# Patient Record
Sex: Female | Born: 1945 | Race: White | Hispanic: No | Marital: Married
Health system: Midwestern US, Academic
[De-identification: ages and names within clinical notes are randomized; demographics above are authoritative.]

---

## 2017-04-18 ENCOUNTER — Ambulatory Visit: Admit: 2017-04-18 | Discharge: 2017-04-19 | Payer: MEDICARE

## 2017-04-18 ENCOUNTER — Encounter: Admit: 2017-04-18 | Discharge: 2017-04-18 | Payer: MEDICARE

## 2017-04-18 DIAGNOSIS — I10 Essential (primary) hypertension: Principal | ICD-10-CM

## 2017-04-18 DIAGNOSIS — E782 Mixed hyperlipidemia: ICD-10-CM

## 2017-04-18 DIAGNOSIS — I440 Atrioventricular block, first degree: ICD-10-CM

## 2017-04-18 DIAGNOSIS — E785 Hyperlipidemia, unspecified: ICD-10-CM

## 2017-04-18 DIAGNOSIS — E7800 Pure hypercholesterolemia, unspecified: ICD-10-CM

## 2017-04-18 MED ORDER — LOSARTAN 25 MG PO TAB
ORAL_TABLET | ORAL | 3 refills | 30.00000 days | Status: AC
Start: 2017-04-18 — End: 2017-04-19

## 2017-04-18 MED ORDER — ATORVASTATIN 40 MG PO TAB
40 mg | ORAL_TABLET | Freq: Every day | ORAL | 3 refills | Status: AC
Start: 2017-04-18 — End: 2018-03-27

## 2017-04-19 ENCOUNTER — Encounter: Admit: 2017-04-19 | Discharge: 2017-04-19 | Payer: MEDICARE

## 2017-04-19 MED ORDER — LOSARTAN 50 MG PO TAB
75 mg | ORAL_TABLET | ORAL | 3 refills | 30.00000 days | Status: AC | PRN
Start: 2017-04-19 — End: 2018-01-24

## 2017-04-19 MED ORDER — LOSARTAN 50 MG PO TAB
75 mg | ORAL_TABLET | ORAL | 3 refills | 90.00000 days | Status: AC | PRN
Start: 2017-04-19 — End: 2017-04-19

## 2017-04-22 LAB — ALT (SGPT): Lab: 21 — ABNORMAL HIGH (ref 0–14)

## 2017-04-22 LAB — BASIC METABOLIC PANEL
Lab: 142
Lab: 4.5
Lab: 81

## 2017-04-22 LAB — LIPID PROFILE
Lab: 121
Lab: 159
Lab: 24 — ABNORMAL HIGH (ref 83–110)
Lab: 3
Lab: 53 — ABNORMAL HIGH (ref 9.8–20.1)

## 2017-04-22 LAB — AST (SGOT): Lab: 15

## 2017-06-20 ENCOUNTER — Encounter: Admit: 2017-06-20 | Discharge: 2017-06-20 | Payer: MEDICARE

## 2017-06-20 DIAGNOSIS — I10 Essential (primary) hypertension: Principal | ICD-10-CM

## 2017-06-20 DIAGNOSIS — E7800 Pure hypercholesterolemia, unspecified: ICD-10-CM

## 2017-07-04 ENCOUNTER — Encounter: Admit: 2017-07-04 | Discharge: 2017-07-04 | Payer: MEDICARE

## 2017-07-04 ENCOUNTER — Ambulatory Visit: Admit: 2017-07-04 | Discharge: 2017-07-05 | Payer: MEDICARE

## 2017-07-04 DIAGNOSIS — I440 Atrioventricular block, first degree: ICD-10-CM

## 2017-07-04 DIAGNOSIS — E7800 Pure hypercholesterolemia, unspecified: ICD-10-CM

## 2017-07-04 DIAGNOSIS — I10 Essential (primary) hypertension: Principal | ICD-10-CM

## 2017-07-04 DIAGNOSIS — E785 Hyperlipidemia, unspecified: ICD-10-CM

## 2017-07-04 MED ORDER — HYDROCHLOROTHIAZIDE 25 MG PO TAB
ORAL_TABLET | ORAL | 3 refills | 28.00000 days | Status: AC
Start: 2017-07-04 — End: 2018-06-09

## 2017-07-04 NOTE — Progress Notes
Date of Service: 07/04/2017    Crystal Savage is a 71 y.o. female.       HPI     I had the pleasure of seeing Crystal Savage today for follow-up of her nonobstructive coronary artery disease, PVC induced cardiomyopathy controlled with flecainide, hypertension, and hyperlipidemia.  A holter monitor in the past found a 30.29 % PVC burden.  She was started on flecainide and follow-up Holter monitor from November 2016 revealed her PVC burden was less than 1%..  Her most recent echocardiogram from 2016 revealed an EF of 55%.  She was seen in our office by Dr. Avie Arenas on Mar 21, 2017 and at that time her blood pressure was elevated.  She was asked to increase her losartan to 50 mg in the morning and 25 mg in the evening if her blood pressures at home are staying greater than 140/90.    She followed up in our office on April 18, 2017 and noted she had not increased her losartan dose.  Her blood pressure was elevated at 156/84 and 154/84.  She was asked to increase her losartan to 50 mg in the morning and 25 mg in the evening.  She was asked to check a BMP in the next few days.  Those results are as below.    Results for Crystal, Savage (MRN [id number]) as of 07/04/2017 08:54   Ref. Range 04/22/2017 00:00   Sodium Unknown 142   Potassium Unknown 4.5   Chloride Unknown 107   CO2 Unknown 25.0   Anion Gap Latest Ref Range: 0 - 14  15 (H)   Blood Urea Nitrogen Latest Ref Range: 9.8 - 20.1  21.0 (H)   Creatinine Unknown 0.84   eGFR Non African American Unknown 70.6   eGFR African American Unknown 81.4   Glucose Latest Ref Range: 83 - 110  116 (H)   Calcium Unknown 9.9   AST (SGOT) Unknown 15   ALT (SGPT) Unknown 21   Cholesterol Unknown 159   Triglycerides Unknown 121   HDL Unknown 53   LDL Unknown 81   VLDL Unknown 24   Cholesterol/HDL Ratio Unknown 3       [name] presents to the office today telling me that she has noticed a few more episodes of lower leg edema recently.  When she is on her feet more she notices that they can be swollen up to her knees and feel tight.  Yesterday she drove from  her home to [facility] and did a lot of sitting.  She had swollen feet and lower legs last night.  She does notice a little dyspnea on exertion when she walks upstairs.  She tells me this has been stable for her for quite some time.  She does not do much exercising.  She is wearing her CPAP consistently at night.  Occasionally she does notice her heart beating a little harder but never faster.  She does notice this at rest.    She brought in her home blood pressure log which shows her blood pressures have been ranging from the 116 - 140s systolic most of the time with occasional blood pressures in the 150s systolic.    Assessment and plan    1.  Hypertension.  Her blood pressure is 140/80 and 138/76 today.  She has been asked to add hydrochlorothiazide 25 mg one half tablet each morning.  This will bring her blood pressure down a little and help with her lower leg  edema.  We also spent quite a bit of time discussing working hard at a less than 2000 mg sodium.  She will continue to sit and check her blood pressure at home.  She has been given a lab order to check a BMP in 1 week and we will call her with these results.  She will follow-up in our office with Dr. Avie Arenas in 2 months.    2.  PVC induced cardiomyopathy that has improved on flecainide therapy.  She occasionally notices her heart beating hard but not past.  She feels she is tolerating her flecainide without any problems.  When she follows up with Dr. Avie Arenas in October she will have a follow-up echocardiogram.    3.  Hyperlipidemia.  Her labs from June were acceptable and she tells me she is tolerating her atorvastatin without any problems.    4.  Obstructive sleep apnea.  She continues to wear her CPAP consistently at night.    Thank you for the opportunity to participate in care of this patient. Please feel free to call Crystal Savage if you have any questions or concerns.    Loraine Leriche, APRN-C  DRB       Vitals:    07/04/17 0751 07/04/17 0802   BP: 140/80 138/76   Pulse: 64    Weight: 86.6 kg (191 lb)    Height: 1.626 m (5' 4)      Body mass index is 32.79 kg/m???.     Past Medical History  Patient Active Problem List    Diagnosis Date Noted   ??? Encounter for monitoring flecainide therapy 03/21/2017   ??? 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 OSA (obstructive sleep apnea) 10/02/2016     Wearing CPAP consistently     ??? LBBB (left bundle branch block) 08/26/2014   ??? Abnormal cardiovascular function study 09/28/2010     10/02/10: Cardiac Cath: EF 40-45%. Mild plaquing to LM, LAD with 30% mid lesion, otherwise non obstructive dz.     ??? Cardiomyopathy (HCC) 09/28/2010   ??? 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 HTN (hypertension) 04/14/2009   ??? Hyperlipidemia 04/14/2009   ??? Under observation for suspected coronary artery disease 04/14/2009     Previous thallium dated 05/31/2005--negative for ischemia. Risk factors for coronary artery disease.            Review of Systems   Constitution: Positive for weight gain.   HENT: Positive for tinnitus.    Eyes: Negative.    Cardiovascular: Negative.    Respiratory: Positive for shortness of breath.    Endocrine: Negative.    Hematologic/Lymphatic: Negative.    Skin: Negative.    Musculoskeletal: Positive for back pain.   Gastrointestinal: Negative.    Genitourinary: Negative.    Neurological: Negative.    Psychiatric/Behavioral: Negative.    Allergic/Immunologic: Negative.    She denies having chest pain,  PND, orthopnea, lightheadedness, dizziness, pre-syncope, syncope, coughing, wheezing,  sputum production, or hemoptysis. She uses no tobacco products.      Physical Exam     General Appearance: resting comfortably, no acute distress, obese  Skin: warm, moist, no ulcers or xanthomas  Digits and Nails: no clubbing Eyes: conjunctivae and lids normal, pupils are equal and round  Lips & Oral Mucosa: no pallor or cyanosis  Ear, Nose, Throat: No deformities  Neck Veins: neck veins are flat, neck veins are not distended  Thyroid: no nodules, masses, tenderness or enlargement  Chest Inspection: chest is normal  in appearance  Respiratory Effort: breathing is unlabored, no respiratory distress  Auscultation/Percussion: lungs clear to auscultation, no rales, rhonchi, or wheezing  PMI: PMI not enlarged or displaced  Cardiac Rhythm: regular rhythm and normal rate  Cardiac Auscultation: Normal S1 & S2, no S3 or S4, no rub  Murmurs: no cardiac murmurs   Carotid Arteries: normal carotid upstroke bilaterally, no bruits  Pedal Pulses: normal symmetric pedal pulses  Lower Extremity Edema: no lower extremity edema  Abdominal Exam: soft, non-tender, no masses, bowel sounds normal  Abdominal Aorta: nonpalpable abdominal aorta; no abdominal bruits  Liver & Spleen: no organomegaly  Gait & Station: normal balance and gait  Muscle Strength: normal strength and tone  Neurologic Exam: neurological assessment grossly intact  Orientation: oriented to time, place and person  Affect & Mood: appropriate and sustained affect  Other: moves all extremities            Problems Addressed Today  No diagnosis found.                  Current Medications (including today's revisions)  ??? ACETAMINOPHEN (TYLENOL ARTHRITIS PAIN PO) Take 650 mg by mouth as Needed.   ??? Aspirin 81 mg Tab Take 1 Tab by mouth Daily.   ??? atorvastatin (LIPITOR) 40 mg tablet Take 1 tablet by mouth daily.   ??? cetirizine (ZYRTEC) 10 mg tablet Take 10 mg by mouth every morning.   ??? flecainide (TAMBOCOR) 50 mg tablet TAKE ONE TABLET BY MOUTH TWICE DAILY   ??? FLUTICASONE PROPIONATE (FLONASE NA) Apply 1 spray into nose as directed daily.   ??? losartan (COZAAR) 50 mg tablet Take 1.5 tablets by mouth as Needed. 1 tablet in the morning and 1/2 tablet in the evening

## 2017-07-04 NOTE — Telephone Encounter
Patient requested echo to be done at [facility].  Order faxed to [facility] scheduling.  Scheduling will call pt schedule echo at the end of Sept.

## 2017-07-12 LAB — BASIC METABOLIC PANEL
Lab: 0.9 U/L (ref 6–29)
Lab: 106 mg/dL (ref 0.2–1.2)
Lab: 108
Lab: 11
Lab: 141 g/dL (ref >60)
Lab: 16 U/L (ref 10–35)
Lab: 28 U/L (ref 33–130)
Lab: 4.1 (calc) (ref 1.0–2.5)
Lab: 9.9

## 2017-07-16 ENCOUNTER — Encounter: Admit: 2017-07-16 | Discharge: 2017-07-16 | Payer: MEDICARE

## 2017-07-16 DIAGNOSIS — I10 Essential (primary) hypertension: Principal | ICD-10-CM

## 2017-07-16 NOTE — Telephone Encounter
Called pt with labs results.

## 2017-08-07 ENCOUNTER — Encounter: Admit: 2017-08-07 | Discharge: 2017-08-07 | Payer: MEDICARE

## 2017-08-07 ENCOUNTER — Ambulatory Visit: Admit: 2017-08-07 | Discharge: 2017-08-08 | Payer: MEDICARE

## 2017-08-07 DIAGNOSIS — I10 Essential (primary) hypertension: Principal | ICD-10-CM

## 2017-08-09 ENCOUNTER — Encounter: Admit: 2017-08-09 | Discharge: 2017-08-09 | Payer: MEDICARE

## 2017-08-09 NOTE — Telephone Encounter
-----   Message from Sabino Gasser, MD sent at 08/09/2017 12:55 PM CDT -----  Regarding: FW: Please review for [facility]  Ejection fraction 45% on echo.  This is about the same as last time.  She sees Dr. Avie Arenas for follow-up in October and this echo was to precede that appointment.  We can let her know that 45% but Dr. Avie Arenas will need to explain to her what that means and if anything needs to be done about it  ----- Message -----  From: Rogelia Boga, RN  Sent: 08/07/2017   2:59 PM  To: Sabino Gasser, MD  Subject: Please review for [facility]                            Dr. Doristine Counter,    Would you please review these results for [facility] while she is out.  Please let me know your recommendations.      Thank you!  Asher Muir  ----- Message -----  From: Lenice Llamas, MD  Sent: 08/07/2017   2:06 PM  To: Nickolas Madrid, MD

## 2017-08-09 NOTE — Telephone Encounter
Left message with results.  Left callback number for any questions or concerns.

## 2017-08-22 ENCOUNTER — Ambulatory Visit: Admit: 2017-08-22 | Discharge: 2017-08-23 | Payer: MEDICARE

## 2017-08-22 ENCOUNTER — Encounter: Admit: 2017-08-22 | Discharge: 2017-08-22 | Payer: MEDICARE

## 2017-08-22 DIAGNOSIS — I447 Left bundle-branch block, unspecified: ICD-10-CM

## 2017-08-22 DIAGNOSIS — I493 Ventricular premature depolarization: ICD-10-CM

## 2017-08-22 DIAGNOSIS — E785 Hyperlipidemia, unspecified: ICD-10-CM

## 2017-08-22 DIAGNOSIS — E7800 Pure hypercholesterolemia, unspecified: ICD-10-CM

## 2017-08-22 DIAGNOSIS — I10 Essential (primary) hypertension: Principal | ICD-10-CM

## 2017-08-22 DIAGNOSIS — I440 Atrioventricular block, first degree: ICD-10-CM

## 2017-08-22 DIAGNOSIS — R002 Palpitations: ICD-10-CM

## 2017-08-22 DIAGNOSIS — I429 Cardiomyopathy, unspecified: ICD-10-CM

## 2017-08-22 DIAGNOSIS — G4733 Obstructive sleep apnea (adult) (pediatric): ICD-10-CM

## 2017-08-22 NOTE — Progress Notes
Date of Service: 08/22/2017    Crystal Savage is a 71 y.o. female.       HPI     This is a very nice 71 year old white female that has a history of hypertension and PVC induced cardiomyopathy.  She reports that in the past month patient experienced to episodes of heart palpitations, it felt like irregular heartbeat, they occurred on July 25, 2017 on August 13, 2017.  This episode lasted up to 2 hours, they were not associated with lightheadedness, dizziness, presyncope or syncope.  It then occurred at night.  Patient decided not to present to the emergency room.  She did measure her blood pressure and the heart rate and she thinks that the pulse was up to 120 bpm.    Today in our office his blood pressure was mildly elevated, the heart rate was well controlled at 66 bpm.  She was evaluated with a 2D echo Doppler study on August 07, 2017, ejection fraction is 45%, the PA pressure was normal, there are no significant valvular abnormalities.         Vitals:    08/22/17 0822 08/22/17 0830   BP: 162/84 156/76   Pulse: 66    Weight: 85.8 kg (189 lb 3.2 oz)    Height: 1.6 m (5' 3)      Body mass index is 33.52 kg/m???.     Past Medical History  Patient Active Problem List    Diagnosis Date Noted   ??? Encounter for monitoring flecainide therapy 03/21/2017   ??? 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 OSA (obstructive sleep apnea) 10/02/2016     Wearing CPAP consistently     ??? LBBB (left bundle branch block) 08/26/2014   ??? Abnormal cardiovascular function study 09/28/2010     10/02/10: Cardiac Cath: EF 40-45%. Mild plaquing to LM, LAD with 30% mid lesion, otherwise non obstructive dz.     ??? Cardiomyopathy (HCC) 09/28/2010   ??? 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 HTN (hypertension) 04/14/2009   ??? Hyperlipidemia 04/14/2009   ??? Under observation for suspected coronary artery disease 04/14/2009     Previous thallium dated 05/31/2005--negative for ischemia. Risk factors for coronary artery disease. Review of Systems   Constitution: Negative.   HENT: Positive for tinnitus.    Eyes: Negative.    Cardiovascular: Positive for dyspnea on exertion, irregular heartbeat and palpitations.   Respiratory: Positive for shortness of breath.    Endocrine: Negative.    Hematologic/Lymphatic: Negative.    Skin: Negative.    Musculoskeletal: Positive for back pain.   Gastrointestinal: Negative.    Genitourinary: Negative.    Neurological: Positive for headaches.   Psychiatric/Behavioral: Negative.    Allergic/Immunologic: Negative.        Physical Exam  General Appearance: normal in appearance  Skin: warm, moist, no ulcers or xanthomas  Eyes: conjunctivae and lids normal, pupils are equal and round  Lips & Oral Mucosa: no pallor or cyanosis  Neck Veins: neck veins are flat, neck veins are not distended  Chest Inspection: chest is normal in appearance  Respiratory Effort: breathing comfortably, no respiratory distress  Auscultation/Percussion: lungs clear to auscultation, no rales or rhonchi, no wheezing  Cardiac Rhythm: regular rhythm and normal rate  Cardiac Auscultation: S1, S2 normal, no rub, no gallop  Murmurs: no murmur  Carotid Arteries: normal carotid upstroke bilaterally, no bruit  Lower Extremity Edema: no lower extremity edema  Abdominal Exam: soft, non-tender, no masses, bowel sounds normal  Liver & Spleen: no organomegaly  Language and Memory: patient responsive and seems to comprehend information  Neurologic Exam: neurological assessment grossly intact      Cardiovascular Studies  Twelve-lead EKG demonstrates normal sinus rhythm, left axis deviation and left anterior fascicular block    Problems Addressed Today  Encounter Diagnoses   Name Primary?   ??? Essential hypertension Yes   ??? Pure hypercholesterolemia    ??? Cardiomyopathy, unspecified type (HCC)    ??? First degree AV block    ??? LBBB (left bundle branch block)    ??? Premature ventricular contractions (PVCs) (VPCs)    ??? OSA (obstructive sleep apnea) ??? Heart palpitations        Assessment and Plan     Assessment:    1.  Recurrent episodes of heart palpitations  ??? Patient experienced 2 episodes, this occurred on July 25, 2017 on August 13, 2017, the lasted up to 2 hours and occurred at night  ??? Patient did not have associated presyncopal episodes    2.  History of poorly controlled hypertension  ??? Today in the office this remains suboptimally controlled  ??? However patient states that at home blood pressure is well controlled, she provided me with a home recordings and I confirmed that    3.  History of PVCs and probably PVC induced cardiomyopathy  ??? In the past on a Holter monitor patient was found to have approximately 30% of the number of beats represented by PVCs  ??? She was evaluated with a heart catheterization in November 2011, she was not found to have any obstructive CAD  ??? Her symptoms and the PVCs have greatly improved after being started on a class Ic antiarrhythmic drug therapy that she continues to the present day (flecainide 50 mg p.o. twice daily).    Plan:    1.  I did suggest to either increase the flecainide  to 100 mg p.o. nightly, continue 50 mg p.o. every morning, the other alternative option would be to continue the present dose of flecainide 50 mg p.o. twice daily and take an extra pill in the setting of heart palpitations.  I think patient opted for the second alternative.  2.  I suggested to have a chemistry profile drawn at your office and correct electrolytes if necessary, the goal is to keep potassium above 4.0 mEq/L and magnesium above 2.0 mg/dL  3.  I asked the patient to continue to monitor her symptoms and be in touch with our office if there are any significant changes.  I also asked her that if she will experience any further episodes that would last longer than 30 minutes to 1 hour she should report either to the emergency room department, your office or our office for an electrocardiogram recording and documentation of her arrhythmia.  4.  I plan to see her back in the office in 6 months.         Current Medications (including today's revisions)  ??? ACETAMINOPHEN (TYLENOL ARTHRITIS PAIN PO) Take 650 mg by mouth as Needed.   ??? Aspirin 81 mg Tab Take 1 Tab by mouth Daily.   ??? atorvastatin (LIPITOR) 40 mg tablet Take 1 tablet by mouth daily.   ??? carvedilol (COREG) 25 mg tablet Take 1 tablet by mouth twice daily.   ??? cetirizine (ZYRTEC) 10 mg tablet Take 10 mg by mouth every morning.   ??? flecainide (TAMBOCOR) 50 mg tablet TAKE ONE TABLET BY MOUTH TWICE DAILY   ??? FLUTICASONE PROPIONATE (  FLONASE NA) Apply 1 spray into nose as directed daily.   ??? hydroCHLOROthiazide (HYDRODIURIL) 25 mg tablet Take 1/2 tablet each am   ??? losartan (COZAAR) 50 mg tablet Take 1.5 tablets by mouth as Needed. 1 tablet in the morning and 1/2 tablet in the evening (Patient taking differently: Take 75 mg by mouth daily. 1 tablet in the morning and 1/2 tablet in the evening)

## 2017-09-22 ENCOUNTER — Encounter: Admit: 2017-09-22 | Discharge: 2017-09-22 | Payer: MEDICARE

## 2017-09-23 MED ORDER — FLECAINIDE 50 MG PO TAB
ORAL_TABLET | Freq: Two times a day (BID) | ORAL | 3 refills | 30.00000 days | Status: AC
Start: 2017-09-23 — End: 2018-03-27

## 2017-10-12 ENCOUNTER — Encounter: Admit: 2017-10-12 | Discharge: 2017-10-12 | Payer: MEDICARE

## 2017-10-12 DIAGNOSIS — I10 Essential (primary) hypertension: ICD-10-CM

## 2017-10-12 DIAGNOSIS — E782 Mixed hyperlipidemia: Principal | ICD-10-CM

## 2017-10-14 MED ORDER — ATORVASTATIN 40 MG PO TAB
ORAL_TABLET | Freq: Every day | 3 refills | Status: AC
Start: 2017-10-14 — End: 2018-11-03

## 2017-12-01 ENCOUNTER — Encounter: Admit: 2017-12-01 | Discharge: 2017-12-01 | Payer: MEDICARE

## 2017-12-01 DIAGNOSIS — E782 Mixed hyperlipidemia: Principal | ICD-10-CM

## 2017-12-01 DIAGNOSIS — I10 Essential (primary) hypertension: ICD-10-CM

## 2017-12-03 MED ORDER — CARVEDILOL 25 MG PO TAB
ORAL_TABLET | Freq: Two times a day (BID) | ORAL | 3 refills | 90.00000 days | Status: AC
Start: 2017-12-03 — End: 2018-05-16

## 2018-01-24 ENCOUNTER — Encounter: Admit: 2018-01-24 | Discharge: 2018-01-24 | Payer: MEDICARE

## 2018-01-24 MED ORDER — LOSARTAN 25 MG PO TAB
ORAL_TABLET | ORAL | 0 refills | 30.00000 days | Status: AC
Start: 2018-01-24 — End: 2018-04-24

## 2018-03-27 ENCOUNTER — Encounter: Admit: 2018-03-27 | Discharge: 2018-03-27 | Payer: MEDICARE

## 2018-03-27 ENCOUNTER — Ambulatory Visit: Admit: 2018-03-27 | Discharge: 2018-03-28 | Payer: MEDICARE

## 2018-03-27 DIAGNOSIS — G4733 Obstructive sleep apnea (adult) (pediatric): ICD-10-CM

## 2018-03-27 DIAGNOSIS — I429 Cardiomyopathy, unspecified: ICD-10-CM

## 2018-03-27 DIAGNOSIS — E7800 Pure hypercholesterolemia, unspecified: ICD-10-CM

## 2018-03-27 DIAGNOSIS — R002 Palpitations: ICD-10-CM

## 2018-03-27 DIAGNOSIS — I10 Essential (primary) hypertension: Principal | ICD-10-CM

## 2018-03-27 DIAGNOSIS — E785 Hyperlipidemia, unspecified: ICD-10-CM

## 2018-03-27 DIAGNOSIS — I493 Ventricular premature depolarization: ICD-10-CM

## 2018-03-27 DIAGNOSIS — I447 Left bundle-branch block, unspecified: ICD-10-CM

## 2018-03-27 DIAGNOSIS — Z5181 Encounter for therapeutic drug level monitoring: ICD-10-CM

## 2018-03-27 DIAGNOSIS — I440 Atrioventricular block, first degree: ICD-10-CM

## 2018-03-27 DIAGNOSIS — Z0389 Encounter for observation for other suspected diseases and conditions ruled out: ICD-10-CM

## 2018-03-27 LAB — BASIC METABOLIC PANEL
Lab: 107
Lab: 9.7
Lab: 0.8
Lab: 122 — ABNORMAL HIGH (ref 83–110)
Lab: 13
Lab: 141
Lab: 20
Lab: 25
Lab: 3.8

## 2018-03-27 MED ORDER — FLECAINIDE 50 MG PO TAB
50-100 mg | ORAL_TABLET | ORAL | 3 refills | 30.00000 days | Status: AC
Start: 2018-03-27 — End: 2018-03-27

## 2018-03-27 MED ORDER — FLECAINIDE 50 MG PO TAB
50-100 mg | ORAL_TABLET | ORAL | 3 refills | 30.00000 days | Status: AC
Start: 2018-03-27 — End: 2018-05-16

## 2018-04-16 ENCOUNTER — Ambulatory Visit: Admit: 2018-04-16 | Discharge: 2018-04-17 | Payer: MEDICARE

## 2018-04-16 DIAGNOSIS — I447 Left bundle-branch block, unspecified: ICD-10-CM

## 2018-04-16 DIAGNOSIS — I493 Ventricular premature depolarization: ICD-10-CM

## 2018-04-16 DIAGNOSIS — E7800 Pure hypercholesterolemia, unspecified: ICD-10-CM

## 2018-04-16 DIAGNOSIS — I10 Essential (primary) hypertension: Principal | ICD-10-CM

## 2018-04-16 DIAGNOSIS — I429 Cardiomyopathy, unspecified: ICD-10-CM

## 2018-04-18 ENCOUNTER — Ambulatory Visit: Admit: 2018-04-18 | Discharge: 2018-04-18 | Payer: MEDICARE

## 2018-04-18 ENCOUNTER — Encounter: Admit: 2018-04-18 | Discharge: 2018-04-18 | Payer: MEDICARE

## 2018-04-18 ENCOUNTER — Ambulatory Visit: Admit: 2018-04-18 | Discharge: 2018-04-19 | Payer: MEDICARE

## 2018-04-18 DIAGNOSIS — I429 Cardiomyopathy, unspecified: ICD-10-CM

## 2018-04-18 DIAGNOSIS — I447 Left bundle-branch block, unspecified: ICD-10-CM

## 2018-04-18 DIAGNOSIS — E7800 Pure hypercholesterolemia, unspecified: ICD-10-CM

## 2018-04-18 DIAGNOSIS — I493 Ventricular premature depolarization: ICD-10-CM

## 2018-04-18 DIAGNOSIS — I10 Essential (primary) hypertension: Principal | ICD-10-CM

## 2018-04-18 MED ORDER — ALBUTEROL SULFATE 90 MCG/ACTUATION IN HFAA
2 | RESPIRATORY_TRACT | 0 refills | Status: DC | PRN
Start: 2018-04-18 — End: 2018-04-23

## 2018-04-18 MED ORDER — AMINOPHYLLINE 500 MG/20 ML IV SOLN
50 mg | INTRAVENOUS | 0 refills | Status: AC | PRN
Start: 2018-04-18 — End: ?

## 2018-04-18 MED ORDER — SODIUM CHLORIDE 0.9 % IV SOLP
250 mL | INTRAVENOUS | 0 refills | Status: AC | PRN
Start: 2018-04-18 — End: ?

## 2018-04-18 MED ORDER — REGADENOSON 0.4 MG/5 ML IV SYRG
.4 mg | Freq: Once | INTRAVENOUS | 0 refills | Status: CP
Start: 2018-04-18 — End: ?

## 2018-04-18 MED ORDER — NITROGLYCERIN 0.4 MG SL SUBL
.4 mg | SUBLINGUAL | 0 refills | Status: AC | PRN
Start: 2018-04-18 — End: ?

## 2018-04-21 ENCOUNTER — Encounter: Admit: 2018-04-21 | Discharge: 2018-04-21 | Payer: MEDICARE

## 2018-04-22 ENCOUNTER — Encounter: Admit: 2018-04-22 | Discharge: 2018-04-22 | Payer: MEDICARE

## 2018-04-24 ENCOUNTER — Encounter: Admit: 2018-04-24 | Discharge: 2018-04-24 | Payer: MEDICARE

## 2018-04-24 MED ORDER — LOSARTAN 25 MG PO TAB
ORAL_TABLET | ORAL | 3 refills | 90.00000 days | Status: AC
Start: 2018-04-24 — End: 2018-10-15

## 2018-05-09 ENCOUNTER — Encounter: Admit: 2018-05-09 | Discharge: 2018-05-09 | Payer: MEDICARE

## 2018-05-09 LAB — TROPONIN-I

## 2018-05-09 LAB — COMPREHENSIVE METABOLIC PANEL
Lab: 0.9 — ABNORMAL LOW (ref 33.0–37.0)
Lab: 1
Lab: 10
Lab: 105
Lab: 120 — ABNORMAL HIGH (ref 83–110)
Lab: 143
Lab: 19
Lab: 27
Lab: 4.3
Lab: 7.5
Lab: 83

## 2018-05-09 LAB — CBC: Lab: 7.5

## 2018-05-16 ENCOUNTER — Ambulatory Visit: Admit: 2018-05-16 | Discharge: 2018-05-17 | Payer: MEDICARE

## 2018-05-16 ENCOUNTER — Encounter: Admit: 2018-05-16 | Discharge: 2018-05-16 | Payer: MEDICARE

## 2018-05-16 DIAGNOSIS — E782 Mixed hyperlipidemia: Principal | ICD-10-CM

## 2018-05-16 DIAGNOSIS — I10 Essential (primary) hypertension: Principal | ICD-10-CM

## 2018-05-16 DIAGNOSIS — I493 Ventricular premature depolarization: ICD-10-CM

## 2018-05-16 DIAGNOSIS — Z5181 Encounter for therapeutic drug level monitoring: ICD-10-CM

## 2018-05-16 DIAGNOSIS — G4733 Obstructive sleep apnea (adult) (pediatric): ICD-10-CM

## 2018-05-16 DIAGNOSIS — I440 Atrioventricular block, first degree: ICD-10-CM

## 2018-05-16 DIAGNOSIS — R9430 Abnormal result of cardiovascular function study, unspecified: ICD-10-CM

## 2018-05-16 DIAGNOSIS — I429 Cardiomyopathy, unspecified: ICD-10-CM

## 2018-05-16 DIAGNOSIS — E785 Hyperlipidemia, unspecified: ICD-10-CM

## 2018-05-16 DIAGNOSIS — R002 Palpitations: ICD-10-CM

## 2018-05-16 DIAGNOSIS — Z0389 Encounter for observation for other suspected diseases and conditions ruled out: ICD-10-CM

## 2018-05-16 DIAGNOSIS — I447 Left bundle-branch block, unspecified: ICD-10-CM

## 2018-05-16 DIAGNOSIS — I480 Paroxysmal atrial fibrillation: ICD-10-CM

## 2018-05-16 MED ORDER — CARVEDILOL 25 MG PO TAB
25-37.5 mg | ORAL_TABLET | ORAL | 3 refills | 90.00000 days | Status: AC
Start: 2018-05-16 — End: 2018-09-05

## 2018-05-16 MED ORDER — FLECAINIDE 100 MG PO TAB
100 mg | ORAL_TABLET | Freq: Two times a day (BID) | ORAL | 3 refills | 30.00000 days | Status: AC
Start: 2018-05-16 — End: 2018-09-05

## 2018-05-19 ENCOUNTER — Encounter: Admit: 2018-05-19 | Discharge: 2018-05-19 | Payer: MEDICARE

## 2018-05-26 ENCOUNTER — Encounter: Admit: 2018-05-26 | Discharge: 2018-05-26 | Payer: MEDICARE

## 2018-06-09 ENCOUNTER — Encounter: Admit: 2018-06-09 | Discharge: 2018-06-09 | Payer: MEDICARE

## 2018-06-09 DIAGNOSIS — I10 Essential (primary) hypertension: Principal | ICD-10-CM

## 2018-06-09 MED ORDER — HYDROCHLOROTHIAZIDE 25 MG PO TAB
ORAL_TABLET | Freq: Every day | ORAL | 3 refills | 28.00000 days | Status: AC
Start: 2018-06-09 — End: 2019-03-17

## 2018-06-13 ENCOUNTER — Encounter: Admit: 2018-06-13 | Discharge: 2018-06-13 | Payer: MEDICARE

## 2018-06-13 MED ORDER — ELIQUIS 5 MG PO TAB
5 mg | ORAL_TABLET | Freq: Two times a day (BID) | ORAL | 3 refills | Status: AC
Start: 2018-06-13 — End: 2018-06-13

## 2018-06-13 MED ORDER — ELIQUIS 5 MG PO TAB
5 mg | ORAL_TABLET | Freq: Two times a day (BID) | ORAL | 3 refills | Status: AC
Start: 2018-06-13 — End: 2018-06-18

## 2018-06-18 ENCOUNTER — Encounter: Admit: 2018-06-18 | Discharge: 2018-06-18 | Payer: MEDICARE

## 2018-06-18 ENCOUNTER — Ambulatory Visit: Admit: 2018-06-18 | Discharge: 2018-06-19 | Payer: MEDICARE

## 2018-06-18 DIAGNOSIS — I440 Atrioventricular block, first degree: ICD-10-CM

## 2018-06-18 DIAGNOSIS — I480 Paroxysmal atrial fibrillation: Principal | ICD-10-CM

## 2018-06-18 DIAGNOSIS — E785 Hyperlipidemia, unspecified: ICD-10-CM

## 2018-06-18 DIAGNOSIS — I10 Essential (primary) hypertension: Principal | ICD-10-CM

## 2018-06-18 MED ORDER — ELIQUIS 5 MG PO TAB
5 mg | ORAL_TABLET | Freq: Two times a day (BID) | ORAL | 3 refills | Status: AC
Start: 2018-06-18 — End: 2019-03-17

## 2018-06-23 ENCOUNTER — Encounter: Admit: 2018-06-23 | Discharge: 2018-06-23 | Payer: MEDICARE

## 2018-06-25 ENCOUNTER — Encounter: Admit: 2018-06-25 | Discharge: 2018-06-25 | Payer: MEDICARE

## 2018-06-27 ENCOUNTER — Encounter: Admit: 2018-06-27 | Discharge: 2018-06-27 | Payer: MEDICARE

## 2018-07-04 ENCOUNTER — Encounter: Admit: 2018-07-04 | Discharge: 2018-07-04 | Payer: MEDICARE

## 2018-07-04 DIAGNOSIS — I480 Paroxysmal atrial fibrillation: Principal | ICD-10-CM

## 2018-07-04 MED ORDER — SODIUM CHLORIDE 0.9 % IV SOLP
INTRAVENOUS | 0 refills | Status: CN
Start: 2018-07-04 — End: ?

## 2018-07-04 MED ORDER — PANTOPRAZOLE 40 MG PO TBEC
ORAL_TABLET | Freq: Two times a day (BID) | ORAL | 1 refills | 90.00000 days | Status: AC
Start: 2018-07-04 — End: 2019-03-17

## 2018-07-04 MED ORDER — LIDOCAINE HCL 2 % MM JELP
Freq: Once | TOPICAL | 0 refills | Status: CN
Start: 2018-07-04 — End: ?

## 2018-07-04 MED ORDER — LIDOCAINE (PF) 10 MG/ML (1 %) IJ SOLN
.1-2 mL | INTRAMUSCULAR | 0 refills | Status: CN | PRN
Start: 2018-07-04 — End: ?

## 2018-07-25 ENCOUNTER — Encounter: Admit: 2018-07-25 | Discharge: 2018-07-25 | Payer: MEDICARE

## 2018-07-28 ENCOUNTER — Encounter: Admit: 2018-07-28 | Discharge: 2018-07-28 | Payer: MEDICARE

## 2018-08-05 ENCOUNTER — Ambulatory Visit: Admit: 2018-08-05 | Discharge: 2018-08-05 | Payer: MEDICARE

## 2018-08-05 ENCOUNTER — Encounter: Admit: 2018-08-05 | Discharge: 2018-08-05 | Payer: MEDICARE

## 2018-08-05 ENCOUNTER — Ambulatory Visit: Admit: 2018-08-05 | Discharge: 2018-08-06 | Payer: MEDICARE

## 2018-08-05 DIAGNOSIS — M4802 Spinal stenosis, cervical region: ICD-10-CM

## 2018-08-05 DIAGNOSIS — I447 Left bundle-branch block, unspecified: ICD-10-CM

## 2018-08-05 DIAGNOSIS — I10 Essential (primary) hypertension: ICD-10-CM

## 2018-08-05 DIAGNOSIS — I480 Paroxysmal atrial fibrillation: Principal | ICD-10-CM

## 2018-08-05 DIAGNOSIS — I493 Ventricular premature depolarization: ICD-10-CM

## 2018-08-05 DIAGNOSIS — K449 Diaphragmatic hernia without obstruction or gangrene: ICD-10-CM

## 2018-08-05 DIAGNOSIS — E785 Hyperlipidemia, unspecified: ICD-10-CM

## 2018-08-05 DIAGNOSIS — Z0183 Encounter for blood typing: ICD-10-CM

## 2018-08-05 DIAGNOSIS — G4733 Obstructive sleep apnea (adult) (pediatric): ICD-10-CM

## 2018-08-05 DIAGNOSIS — I440 Atrioventricular block, first degree: ICD-10-CM

## 2018-08-05 DIAGNOSIS — K759 Inflammatory liver disease, unspecified: ICD-10-CM

## 2018-08-05 DIAGNOSIS — K929 Disease of digestive system, unspecified: ICD-10-CM

## 2018-08-05 DIAGNOSIS — I2510 Atherosclerotic heart disease of native coronary artery without angina pectoris: ICD-10-CM

## 2018-08-05 LAB — POC CREATININE, RAD: Lab: 0.5 mg/dL (ref >60)

## 2018-08-05 LAB — MAGNESIUM: Lab: 2.1 mg/dL (ref 1.6–2.6)

## 2018-08-05 LAB — CBC
Lab: 13 g/dL — ABNORMAL HIGH (ref 12.0–15.0)
Lab: 38 % (ref 36–45)
Lab: 4.2 M/UL (ref 4.0–5.0)
Lab: 5.9 K/UL — AB (ref 4.5–11.0)

## 2018-08-05 LAB — BASIC METABOLIC PANEL
Lab: 139 MMOL/L (ref 137–147)
Lab: 4 MMOL/L (ref 3.5–5.1)

## 2018-08-05 MED ORDER — SODIUM CHLORIDE 0.9 % IJ SOLN
50 mL | Freq: Once | INTRAVENOUS | 0 refills | Status: CP
Start: 2018-08-05 — End: ?
  Administered 2018-08-05: 18:00:00 50 mL via INTRAVENOUS

## 2018-08-05 MED ORDER — IOPAMIDOL 76 % IV SOLN
80 mL | Freq: Once | INTRAVENOUS | 0 refills | Status: CP
Start: 2018-08-05 — End: ?
  Administered 2018-08-05: 18:00:00 80 mL via INTRAVENOUS

## 2018-08-08 ENCOUNTER — Encounter: Admit: 2018-08-08 | Discharge: 2018-08-08 | Payer: MEDICARE

## 2018-08-08 DIAGNOSIS — I480 Paroxysmal atrial fibrillation: Principal | ICD-10-CM

## 2018-08-08 DIAGNOSIS — R0989 Other specified symptoms and signs involving the circulatory and respiratory systems: ICD-10-CM

## 2018-08-08 MED ORDER — ALUMINUM-MAGNESIUM HYDROXIDE 200-200 MG/5 ML PO SUSP
30 mL | ORAL | 0 refills | Status: CN | PRN
Start: 2018-08-08 — End: ?

## 2018-08-08 MED ORDER — ACETAMINOPHEN 325 MG PO TAB
650 mg | ORAL | 0 refills | Status: CN | PRN
Start: 2018-08-08 — End: ?

## 2018-08-11 ENCOUNTER — Encounter: Admit: 2018-08-11 | Discharge: 2018-08-11 | Payer: MEDICARE

## 2018-08-11 ENCOUNTER — Ambulatory Visit: Admit: 2018-08-11 | Discharge: 2018-08-11 | Payer: MEDICARE

## 2018-08-11 DIAGNOSIS — E785 Hyperlipidemia, unspecified: ICD-10-CM

## 2018-08-11 DIAGNOSIS — M4802 Spinal stenosis, cervical region: ICD-10-CM

## 2018-08-11 DIAGNOSIS — K929 Disease of digestive system, unspecified: ICD-10-CM

## 2018-08-11 DIAGNOSIS — K449 Diaphragmatic hernia without obstruction or gangrene: ICD-10-CM

## 2018-08-11 DIAGNOSIS — I440 Atrioventricular block, first degree: ICD-10-CM

## 2018-08-11 DIAGNOSIS — I2510 Atherosclerotic heart disease of native coronary artery without angina pectoris: ICD-10-CM

## 2018-08-11 DIAGNOSIS — K759 Inflammatory liver disease, unspecified: ICD-10-CM

## 2018-08-11 DIAGNOSIS — I10 Essential (primary) hypertension: Principal | ICD-10-CM

## 2018-08-11 LAB — POC ACTIVATED CLOTTING TIME
Lab: 412 s (ref 3–12)
Lab: 469 s
Lab: 377 s — ABNORMAL LOW (ref >59)
Lab: 413 s — ABNORMAL LOW (ref 134–144)
Lab: 334 s — ABNORMAL HIGH (ref 8.6–10.2)
Lab: 362 s (ref 3.6–4.8)
Lab: 466 s (ref 0–44)
Lab: 188 s

## 2018-08-11 LAB — POC PT/INR: Lab: 1.2 pg (ref 0.8–1.2)

## 2018-08-11 MED ORDER — PROMETHAZINE 25 MG/ML IJ SOLN
6.25 mg | INTRAVENOUS | 0 refills | Status: DC | PRN
Start: 2018-08-11 — End: 2018-08-12

## 2018-08-11 MED ORDER — CETIRIZINE 10 MG PO TAB
10 mg | Freq: Every morning | ORAL | 0 refills | Status: DC
Start: 2018-08-11 — End: 2018-08-12

## 2018-08-11 MED ORDER — SUCCINYLCHOLINE CHLORIDE 20 MG/ML IJ SOLN
INTRAVENOUS | 0 refills | Status: DC
Start: 2018-08-11 — End: 2018-08-11

## 2018-08-11 MED ORDER — LIDOCAINE HCL 2 % MM JELP
Freq: Once | TOPICAL | 0 refills | Status: AC
Start: 2018-08-11 — End: ?

## 2018-08-11 MED ORDER — PANTOPRAZOLE 40 MG PO TBEC
40 mg | Freq: Two times a day (BID) | ORAL | 0 refills | Status: DC
Start: 2018-08-11 — End: 2018-08-12
  Administered 2018-08-12: 02:00:00 40 mg via ORAL

## 2018-08-11 MED ORDER — DIPHENHYDRAMINE HCL 50 MG/ML IJ SOLN
25 mg | Freq: Once | INTRAVENOUS | 0 refills | Status: AC | PRN
Start: 2018-08-11 — End: ?

## 2018-08-11 MED ORDER — PROPOFOL INJ 10 MG/ML IV VIAL
0 refills | Status: DC
Start: 2018-08-11 — End: 2018-08-11

## 2018-08-11 MED ORDER — LIDOCAINE (PF) 10 MG/ML (1 %) IJ SOLN
.1-2 mL | INTRAMUSCULAR | 0 refills | Status: DC | PRN
Start: 2018-08-11 — End: 2018-08-12

## 2018-08-11 MED ORDER — ATORVASTATIN 40 MG PO TAB
40 mg | Freq: Every evening | ORAL | 0 refills | Status: DC
Start: 2018-08-11 — End: 2018-08-12
  Administered 2018-08-12: 02:00:00 40 mg via ORAL

## 2018-08-11 MED ORDER — IMS MIXTURE TEMPLATE
37.5 mg | Freq: Every day | ORAL | 0 refills | Status: DC
Start: 2018-08-11 — End: 2018-08-12

## 2018-08-11 MED ORDER — PHENYLEPHRINE IN 0.9% NACL(PF) 1 MG/10 ML (100 MCG/ML) IV SYRG
INTRAVENOUS | 0 refills | Status: DC
Start: 2018-08-11 — End: 2018-08-11

## 2018-08-11 MED ORDER — FLECAINIDE 100 MG PO TAB
100 mg | Freq: Two times a day (BID) | ORAL | 0 refills | Status: DC
Start: 2018-08-11 — End: 2018-08-12
  Administered 2018-08-11: 21:00:00 100 mg via ORAL

## 2018-08-11 MED ORDER — HYDROCODONE-ACETAMINOPHEN 5-325 MG PO TAB
1 | ORAL | 0 refills | Status: DC | PRN
Start: 2018-08-11 — End: 2018-08-12

## 2018-08-11 MED ORDER — IMS MIXTURE TEMPLATE
37.5 mg | ORAL | 0 refills | Status: DC
Start: 2018-08-11 — End: 2018-08-11

## 2018-08-11 MED ORDER — PHENYLEPHRINE IV DRIP (STD CONC)
0 refills | Status: DC
Start: 2018-08-11 — End: 2018-08-11

## 2018-08-11 MED ORDER — LOSARTAN 50 MG PO TAB
50 mg | Freq: Every evening | ORAL | 0 refills | Status: DC
Start: 2018-08-11 — End: 2018-08-12
  Administered 2018-08-12: 02:00:00 50 mg via ORAL

## 2018-08-11 MED ORDER — ONDANSETRON HCL (PF) 4 MG/2 ML IJ SOLN
INTRAVENOUS | 0 refills | Status: DC
Start: 2018-08-11 — End: 2018-08-11

## 2018-08-11 MED ORDER — SODIUM CHLORIDE 0.9 % IV SOLP
INTRAVENOUS | 0 refills | Status: DC
Start: 2018-08-11 — End: 2018-08-12
  Administered 2018-08-11: 12:00:00 1000 mL via INTRAVENOUS
  Administered 2018-08-11: 13:00:00 1000.000 mL via INTRAVENOUS

## 2018-08-11 MED ORDER — CARVEDILOL 25 MG PO TAB
25 mg | Freq: Two times a day (BID) | ORAL | 0 refills | Status: DC
Start: 2018-08-11 — End: 2018-08-11

## 2018-08-11 MED ORDER — CARVEDILOL 25 MG PO TAB
25 mg | ORAL | 0 refills | Status: DC
Start: 2018-08-11 — End: 2018-08-11

## 2018-08-11 MED ORDER — DEXTRAN 70-HYPROMELLOSE (PF) 0.1-0.3 % OP DPET
0 refills | Status: DC
Start: 2018-08-11 — End: 2018-08-11

## 2018-08-11 MED ORDER — FLUTICASONE PROPIONATE 50 MCG/ACTUATION NA SPSN
2 | Freq: Every day | NASAL | 0 refills | Status: DC
Start: 2018-08-11 — End: 2018-08-12

## 2018-08-11 MED ORDER — ASPIRIN 81 MG PO TBEC
81 mg | Freq: Every evening | ORAL | 0 refills | Status: DC
Start: 2018-08-11 — End: 2018-08-12
  Administered 2018-08-12: 02:00:00 81 mg via ORAL

## 2018-08-11 MED ORDER — PROTAMINE IVPB
20 mg | INTRAVENOUS | 0 refills | Status: AC | PRN
Start: 2018-08-11 — End: ?

## 2018-08-11 MED ORDER — APIXABAN 5 MG PO TAB
5 mg | Freq: Two times a day (BID) | ORAL | 0 refills | Status: DC
Start: 2018-08-11 — End: 2018-08-12
  Administered 2018-08-11: 23:00:00 5 mg via ORAL

## 2018-08-11 MED ORDER — ACETAMINOPHEN 325 MG PO TAB
650 mg | ORAL | 0 refills | Status: DC | PRN
Start: 2018-08-11 — End: 2018-08-12

## 2018-08-11 MED ORDER — FAMOTIDINE (PF) 20 MG/2 ML IV SOLN
0 refills | Status: DC
Start: 2018-08-11 — End: 2018-08-11

## 2018-08-11 MED ORDER — CARVEDILOL 25 MG PO TAB
25 mg | Freq: Every evening | ORAL | 0 refills | Status: DC
Start: 2018-08-11 — End: 2018-08-12
  Administered 2018-08-12: 02:00:00 25 mg via ORAL

## 2018-08-11 MED ORDER — FENTANYL CITRATE (PF) 50 MCG/ML IJ SOLN
25 ug | INTRAVENOUS | 0 refills | Status: DC | PRN
Start: 2018-08-11 — End: 2018-08-12

## 2018-08-11 MED ORDER — DEXAMETHASONE SODIUM PHOSPHATE 4 MG/ML IJ SOLN
INTRAVENOUS | 0 refills | Status: DC
Start: 2018-08-11 — End: 2018-08-11

## 2018-08-11 MED ORDER — SUCRALFATE 1 GRAM PO TAB
1 g | Freq: Two times a day (BID) | ORAL | 0 refills | Status: DC
Start: 2018-08-11 — End: 2018-08-12
  Administered 2018-08-12: 02:00:00 1 g via ORAL

## 2018-08-11 MED ORDER — HYDROCODONE-ACETAMINOPHEN 5-325 MG PO TAB
1-2 | Freq: Once | ORAL | 0 refills | Status: AC | PRN
Start: 2018-08-11 — End: ?

## 2018-08-11 MED ORDER — SUCRALFATE 1 GRAM PO TAB
1 g | ORAL_TABLET | Freq: Two times a day (BID) | ORAL | 0 refills | Status: CN
Start: 2018-08-11 — End: ?

## 2018-08-11 MED ORDER — LIDOCAINE (PF) 200 MG/10 ML (2 %) IJ SYRG
0 refills | Status: DC
Start: 2018-08-11 — End: 2018-08-11

## 2018-08-11 MED ORDER — ALUMINUM-MAGNESIUM HYDROXIDE 200-200 MG/5 ML PO SUSP
30 mL | ORAL | 0 refills | Status: DC | PRN
Start: 2018-08-11 — End: 2018-08-12

## 2018-08-11 MED ORDER — FENTANYL CITRATE (PF) 50 MCG/ML IJ SOLN
50 ug | Freq: Once | INTRAVENOUS | 0 refills | Status: AC | PRN
Start: 2018-08-11 — End: ?

## 2018-08-11 MED ORDER — MEPERIDINE (PF) 25 MG/ML IJ SYRG
12.5 mg | INTRAVENOUS | 0 refills | Status: DC | PRN
Start: 2018-08-11 — End: 2018-08-12

## 2018-08-11 MED ORDER — LOSARTAN 50 MG PO TAB
100 mg | Freq: Every day | ORAL | 0 refills | Status: DC
Start: 2018-08-11 — End: 2018-08-12
  Administered 2018-08-11: 21:00:00 100 mg via ORAL

## 2018-08-11 MED ORDER — HYDROCHLOROTHIAZIDE 12.5 MG PO TAB
12.5 mg | Freq: Every day | ORAL | 0 refills | Status: DC
Start: 2018-08-11 — End: 2018-08-12
  Administered 2018-08-11: 21:00:00 12.5 mg via ORAL

## 2018-08-12 ENCOUNTER — Encounter: Admit: 2018-08-11 | Discharge: 2018-08-12 | Payer: MEDICARE

## 2018-08-12 DIAGNOSIS — I480 Paroxysmal atrial fibrillation: Principal | ICD-10-CM

## 2018-08-12 DIAGNOSIS — I483 Typical atrial flutter: ICD-10-CM

## 2018-08-12 LAB — CBC
Lab: 12 10*3/uL — ABNORMAL HIGH (ref 4.5–11.0)
Lab: 12 g/dL (ref 12.0–15.0)
Lab: 14 % (ref 11–15)
Lab: 197 10*3/uL (ref >60)
Lab: 29 pg (ref 26–34)
Lab: 32 g/dL (ref 32.0–36.0)
Lab: 36 % (ref 36–45)
Lab: 4.1 M/UL (ref 4.0–5.0)
Lab: 7.8 FL (ref >60)
Lab: 88 FL — ABNORMAL HIGH (ref 80–100)

## 2018-08-12 LAB — BASIC METABOLIC PANEL: Lab: 138 MMOL/L — ABNORMAL LOW (ref 137–147)

## 2018-08-12 MED ORDER — SUCRALFATE 1 GRAM PO TAB
1 g | ORAL_TABLET | Freq: Two times a day (BID) | ORAL | 0 refills | Status: AC
Start: 2018-08-12 — End: 2019-03-17

## 2018-08-13 ENCOUNTER — Encounter: Admit: 2018-08-13 | Discharge: 2018-08-13 | Payer: MEDICARE

## 2018-08-13 DIAGNOSIS — K449 Diaphragmatic hernia without obstruction or gangrene: ICD-10-CM

## 2018-08-13 DIAGNOSIS — E785 Hyperlipidemia, unspecified: ICD-10-CM

## 2018-08-13 DIAGNOSIS — I440 Atrioventricular block, first degree: ICD-10-CM

## 2018-08-13 DIAGNOSIS — I10 Essential (primary) hypertension: Principal | ICD-10-CM

## 2018-08-13 DIAGNOSIS — M4802 Spinal stenosis, cervical region: ICD-10-CM

## 2018-08-13 DIAGNOSIS — I2510 Atherosclerotic heart disease of native coronary artery without angina pectoris: ICD-10-CM

## 2018-08-13 DIAGNOSIS — K759 Inflammatory liver disease, unspecified: ICD-10-CM

## 2018-08-13 DIAGNOSIS — K929 Disease of digestive system, unspecified: ICD-10-CM

## 2018-08-18 ENCOUNTER — Encounter: Admit: 2018-08-18 | Discharge: 2018-08-18 | Payer: MEDICARE

## 2018-09-02 ENCOUNTER — Encounter: Admit: 2018-09-02 | Discharge: 2018-09-02 | Payer: MEDICARE

## 2018-09-05 ENCOUNTER — Ambulatory Visit: Admit: 2018-09-05 | Discharge: 2018-09-06 | Payer: MEDICARE

## 2018-09-05 ENCOUNTER — Encounter: Admit: 2018-09-05 | Discharge: 2018-09-05 | Payer: MEDICARE

## 2018-09-05 DIAGNOSIS — I2510 Atherosclerotic heart disease of native coronary artery without angina pectoris: ICD-10-CM

## 2018-09-05 DIAGNOSIS — K929 Disease of digestive system, unspecified: ICD-10-CM

## 2018-09-05 DIAGNOSIS — E785 Hyperlipidemia, unspecified: ICD-10-CM

## 2018-09-05 DIAGNOSIS — M4802 Spinal stenosis, cervical region: ICD-10-CM

## 2018-09-05 DIAGNOSIS — K759 Inflammatory liver disease, unspecified: ICD-10-CM

## 2018-09-05 DIAGNOSIS — Z09 Encounter for follow-up examination after completed treatment for conditions other than malignant neoplasm: ICD-10-CM

## 2018-09-05 DIAGNOSIS — I10 Essential (primary) hypertension: Principal | ICD-10-CM

## 2018-09-05 DIAGNOSIS — I480 Paroxysmal atrial fibrillation: Principal | ICD-10-CM

## 2018-09-05 DIAGNOSIS — I440 Atrioventricular block, first degree: ICD-10-CM

## 2018-09-05 DIAGNOSIS — K449 Diaphragmatic hernia without obstruction or gangrene: ICD-10-CM

## 2018-09-06 DIAGNOSIS — I493 Ventricular premature depolarization: ICD-10-CM

## 2018-10-15 ENCOUNTER — Encounter: Admit: 2018-10-15 | Discharge: 2018-10-15 | Payer: MEDICARE

## 2018-10-15 MED ORDER — LOSARTAN 25 MG PO TAB
25 mg | ORAL_TABLET | Freq: Every day | ORAL | 3 refills | 90.00000 days | Status: AC
Start: 2018-10-15 — End: 2019-09-30

## 2018-10-15 MED ORDER — LOSARTAN 50 MG PO TAB
50 mg | ORAL_TABLET | Freq: Every day | ORAL | 3 refills | 30.00000 days | Status: AC
Start: 2018-10-15 — End: 2019-09-30

## 2018-10-20 ENCOUNTER — Encounter: Admit: 2018-10-20 | Discharge: 2018-10-20 | Payer: MEDICARE

## 2018-10-20 DIAGNOSIS — I4891 Unspecified atrial fibrillation: Principal | ICD-10-CM

## 2018-10-31 LAB — BASIC METABOLIC PANEL
Lab: 0.8
Lab: 106 mL/min (ref >60)
Lab: 114 — ABNORMAL HIGH (ref 83–110)
Lab: 142 mg/dL (ref 8.5–10.6)
Lab: 17
Lab: 28
Lab: 3.9 mL/min — ABNORMAL LOW (ref >60)
Lab: 71
Lab: 9.7

## 2018-11-03 ENCOUNTER — Encounter: Admit: 2018-11-03 | Discharge: 2018-11-03 | Payer: MEDICARE

## 2018-11-03 DIAGNOSIS — I4891 Unspecified atrial fibrillation: Principal | ICD-10-CM

## 2018-11-03 DIAGNOSIS — I10 Essential (primary) hypertension: ICD-10-CM

## 2018-11-03 DIAGNOSIS — E782 Mixed hyperlipidemia: Principal | ICD-10-CM

## 2018-11-03 MED ORDER — ATORVASTATIN 40 MG PO TAB
40 mg | ORAL_TABLET | Freq: Every evening | ORAL | 2 refills | Status: AC
Start: 2018-11-03 — End: 2019-08-03

## 2018-11-07 ENCOUNTER — Encounter: Admit: 2018-11-07 | Discharge: 2018-11-07 | Payer: MEDICARE

## 2018-11-07 DIAGNOSIS — E782 Mixed hyperlipidemia: Principal | ICD-10-CM

## 2018-11-07 LAB — LIPID PROFILE
Lab: 155 — ABNORMAL HIGH (ref 30–150)
Lab: 161
Lab: 3
Lab: 31
Lab: 49
Lab: 85

## 2018-11-07 LAB — LIVER FUNCTION PANEL: Lab: 0.5

## 2018-11-10 ENCOUNTER — Ambulatory Visit: Admit: 2018-11-10 | Discharge: 2018-11-10 | Payer: MEDICARE

## 2018-11-10 ENCOUNTER — Encounter: Admit: 2018-11-10 | Discharge: 2018-11-10 | Payer: MEDICARE

## 2018-11-10 DIAGNOSIS — I480 Paroxysmal atrial fibrillation: Principal | ICD-10-CM

## 2018-11-10 DIAGNOSIS — Z09 Encounter for follow-up examination after completed treatment for conditions other than malignant neoplasm: ICD-10-CM

## 2018-11-10 MED ORDER — SODIUM CHLORIDE 0.9 % IJ SOLN
50 mL | Freq: Once | INTRAVENOUS | 0 refills | Status: CP
Start: 2018-11-10 — End: ?
  Administered 2018-11-10: 17:00:00 50 mL via INTRAVENOUS

## 2018-11-10 MED ORDER — IOPAMIDOL 76 % IV SOLN
80 mL | Freq: Once | INTRAVENOUS | 0 refills | Status: CP
Start: 2018-11-10 — End: ?
  Administered 2018-11-10: 17:00:00 80 mL via INTRAVENOUS

## 2018-11-21 ENCOUNTER — Encounter: Admit: 2018-11-21 | Discharge: 2018-11-21 | Payer: MEDICARE

## 2018-12-09 ENCOUNTER — Encounter: Admit: 2018-12-09 | Discharge: 2018-12-09 | Payer: MEDICARE

## 2018-12-10 ENCOUNTER — Encounter: Admit: 2018-12-10 | Discharge: 2018-12-10 | Payer: MEDICARE

## 2018-12-10 ENCOUNTER — Ambulatory Visit: Admit: 2018-12-10 | Discharge: 2018-12-11 | Payer: MEDICARE

## 2018-12-10 DIAGNOSIS — I10 Essential (primary) hypertension: Secondary | ICD-10-CM

## 2018-12-10 DIAGNOSIS — K759 Inflammatory liver disease, unspecified: Secondary | ICD-10-CM

## 2018-12-10 DIAGNOSIS — K449 Diaphragmatic hernia without obstruction or gangrene: Secondary | ICD-10-CM

## 2018-12-10 DIAGNOSIS — M4802 Spinal stenosis, cervical region: Secondary | ICD-10-CM

## 2018-12-10 DIAGNOSIS — I2510 Atherosclerotic heart disease of native coronary artery without angina pectoris: Secondary | ICD-10-CM

## 2018-12-10 DIAGNOSIS — I480 Paroxysmal atrial fibrillation: Secondary | ICD-10-CM

## 2018-12-10 DIAGNOSIS — I440 Atrioventricular block, first degree: Secondary | ICD-10-CM

## 2018-12-10 DIAGNOSIS — E785 Hyperlipidemia, unspecified: Secondary | ICD-10-CM

## 2018-12-10 DIAGNOSIS — K929 Disease of digestive system, unspecified: Secondary | ICD-10-CM

## 2018-12-11 ENCOUNTER — Encounter: Admit: 2018-12-11 | Discharge: 2018-12-11 | Payer: MEDICARE

## 2018-12-11 DIAGNOSIS — E785 Hyperlipidemia, unspecified: Secondary | ICD-10-CM

## 2018-12-11 DIAGNOSIS — K929 Disease of digestive system, unspecified: Secondary | ICD-10-CM

## 2018-12-11 DIAGNOSIS — K449 Diaphragmatic hernia without obstruction or gangrene: Secondary | ICD-10-CM

## 2018-12-11 DIAGNOSIS — K759 Inflammatory liver disease, unspecified: Secondary | ICD-10-CM

## 2018-12-11 DIAGNOSIS — I440 Atrioventricular block, first degree: Secondary | ICD-10-CM

## 2018-12-11 DIAGNOSIS — I10 Essential (primary) hypertension: Secondary | ICD-10-CM

## 2018-12-11 DIAGNOSIS — M4802 Spinal stenosis, cervical region: Secondary | ICD-10-CM

## 2018-12-11 DIAGNOSIS — I2510 Atherosclerotic heart disease of native coronary artery without angina pectoris: Secondary | ICD-10-CM

## 2019-03-12 ENCOUNTER — Encounter: Admit: 2019-03-12 | Discharge: 2019-03-12 | Payer: MEDICARE

## 2019-03-13 ENCOUNTER — Encounter: Admit: 2019-03-13 | Discharge: 2019-03-13 | Payer: MEDICARE

## 2019-03-17 ENCOUNTER — Encounter: Admit: 2019-03-17 | Discharge: 2019-03-17 | Payer: MEDICARE

## 2019-03-17 ENCOUNTER — Ambulatory Visit: Admit: 2019-03-17 | Discharge: 2019-03-17 | Payer: MEDICARE

## 2019-03-17 DIAGNOSIS — I440 Atrioventricular block, first degree: ICD-10-CM

## 2019-03-17 DIAGNOSIS — Z0389 Encounter for observation for other suspected diseases and conditions ruled out: ICD-10-CM

## 2019-03-17 DIAGNOSIS — I493 Ventricular premature depolarization: ICD-10-CM

## 2019-03-17 DIAGNOSIS — K449 Diaphragmatic hernia without obstruction or gangrene: ICD-10-CM

## 2019-03-17 DIAGNOSIS — G4733 Obstructive sleep apnea (adult) (pediatric): ICD-10-CM

## 2019-03-17 DIAGNOSIS — M4802 Spinal stenosis, cervical region: ICD-10-CM

## 2019-03-17 DIAGNOSIS — E7800 Pure hypercholesterolemia, unspecified: ICD-10-CM

## 2019-03-17 DIAGNOSIS — I2510 Atherosclerotic heart disease of native coronary artery without angina pectoris: ICD-10-CM

## 2019-03-17 DIAGNOSIS — I4891 Unspecified atrial fibrillation: ICD-10-CM

## 2019-03-17 DIAGNOSIS — I447 Left bundle-branch block, unspecified: ICD-10-CM

## 2019-03-17 DIAGNOSIS — K929 Disease of digestive system, unspecified: ICD-10-CM

## 2019-03-17 DIAGNOSIS — I10 Essential (primary) hypertension: Principal | ICD-10-CM

## 2019-03-17 DIAGNOSIS — R002 Palpitations: ICD-10-CM

## 2019-03-17 DIAGNOSIS — E785 Hyperlipidemia, unspecified: ICD-10-CM

## 2019-03-17 DIAGNOSIS — K759 Inflammatory liver disease, unspecified: ICD-10-CM

## 2019-03-17 MED ORDER — ELIQUIS 5 MG PO TAB
5 mg | ORAL_TABLET | Freq: Two times a day (BID) | ORAL | 3 refills | Status: DC
Start: 2019-03-17 — End: 2020-03-31

## 2019-03-17 MED ORDER — CARVEDILOL 25 MG PO TAB
25 mg | ORAL_TABLET | Freq: Two times a day (BID) | ORAL | 3 refills | 90.00000 days | Status: DC
Start: 2019-03-17 — End: 2020-03-31

## 2019-03-17 MED ORDER — FLECAINIDE 50 MG PO TAB
25 mg | ORAL_TABLET | Freq: Two times a day (BID) | ORAL | 3 refills | 30.00000 days | Status: DC
Start: 2019-03-17 — End: 2020-03-31

## 2019-03-17 MED ORDER — HYDROCHLOROTHIAZIDE 25 MG PO TAB
ORAL_TABLET | Freq: Every day | ORAL | 3 refills | 28.00000 days | Status: DC
Start: 2019-03-17 — End: 2020-03-31

## 2019-03-17 NOTE — Progress Notes
Obtained patient's verbal consent to treat them and their agreement to [facility] financial policy and NPP via this telehealth visit during the [facility] Emergency    Date of Service: 03/17/2019    JAKAILAH LORINCZ is a 73 y.o. female.       HPI     Today patient was seen with the help of telehealth/video visit.  She is a 73 year old white female with a history of cardiomyopathy, left bundle branch block, increased PVCs on previous Holter monitoring, atrial fibrillation, she underwent pulmonary vein isolation on 08/11/2018.    Today patient reports that she is doing well from a cardiac standpoint.  She has not had chest pain and has not experienced heart palpitations.    She was in the past evaluated with a heart catheterization November 2011 and she was not found to have any obstructive coronary artery disease.  A myocardial perfusion imaging study was most recently performed in June 2019, the tomographic pattern was negative for ischemia.  The most recent echocardiogram performed on April 16, 2018 demonstrated normal left ventricular systolic function, there were no valvular abnormalities and pulmonary artery pressure was normal.  In the past ejection fraction was decreased, there could have been due to PVC induced cardiomyopathy.  Patient was started on flecainide and in terms of symptomatic heart palpitations he responded very well to that.    Currently she is doing well from a cardiac standpoint.  She did try to take a small dose of flecainide, however the symptomatic PVCs recurred and therefore she is currently on 50 mg p.o. twice daily.  The most recent long-term Holter monitor was performed on 11/10/2018, it demonstrated sinus rhythm without any evidence of atrial fibrillation and occasional episodes of paroxysmal atrial tachycardia.  Due to a high CHA2DS2-VASc score  patient was continued on anticoagulation with apixaban.                Vitals:    03/17/19 0842   BP: 122/68 Cardiovascular Studies      Problems Addressed Today  Encounter Diagnoses   Name Primary?   ??? Atrial fibrillation, unspecified type (HCC) Yes   ??? Essential hypertension    ??? Under observation for suspected coronary artery disease    ??? Premature ventricular contractions (PVCs) (VPCs)    ??? LBBB (left bundle branch block)    ??? Pure hypercholesterolemia    ??? OSA (obstructive sleep apnea)    ??? Heart palpitations        Assessment and Plan     In summary: This is a 73 year old white female with a history of symptomatic paroxysmal atrial fibrillation, patient is status post successful pulmonary vein isolation on 08/11/2018, she also has a history of symptomatic PVCs that historically have responded well to flecainide, patient does have a history of cardiomyopathy, current medical study function recovered to normal range.  She does not have any documented history of obstructive CAD by previous left heart catheterization performed in November 2011 as well as a regadenoson MPI performed in June 2019.  Patient does have a known LBBB.  Overall, currently she is asymptomatic from a cardiac standpoint.    Plan:    1.  We will call in flecainide 50 mg p.o. twice daily, patient will try to take half tablet twice daily or she can even try 50 mg p.o. every morning and 25 p.o. nightly, I advised her to try these combinations and see whether that will suppress her heart palpitations.  She would like  to take less antiarrhythmic drug therapy.  2.  Continue all other medications including anticoagulation with apixaban.  3.  Continue risk factors modification  4.  2D echo Doppler study evaluation and follow-up in approximately 6 months.         Current Medications (including today's revisions)  ??? aspirin EC 81 mg tablet Take 81 mg by mouth at bedtime daily. Take with food.   ??? atorvastatin (LIPITOR) 40 mg tablet Take one tablet by mouth at bedtime daily.   ??? carvedilol (COREG) 25 mg tablet Take 25 mg by mouth twice daily with meals. Take with food.   ??? cetirizine (ZYRTEC) 10 mg tablet Take 10 mg by mouth every morning.   ??? ELIQUIS 5 mg tablet Take one tablet by mouth twice daily.   ??? flecainide (TAMBOCOR) 50 mg tablet Take 50 mg by mouth twice daily.   ??? fluticasone propionate (FLONASE) 50 mcg/actuation nasal spray Apply 2 sprays to each nostril as directed daily. Shake bottle gently before using.   ??? hydroCHLOROthiazide (HYDRODIURIL) 25 mg tablet TAKE 1/2 (ONE-HALF) TABLET BY MOUTH ONCE DAILY IN THE MORNING   ??? losartan (COZAAR) 25 mg tablet Take one tablet by mouth daily with dinner.   ??? losartan (COZAAR) 50 mg tablet Take one tablet by mouth daily with breakfast.

## 2019-08-02 ENCOUNTER — Encounter: Admit: 2019-08-02 | Discharge: 2019-08-02 | Payer: MEDICARE

## 2019-08-03 MED ORDER — ATORVASTATIN 40 MG PO TAB
ORAL_TABLET | Freq: Every day | 0 refills | Status: DC
Start: 2019-08-03 — End: 2019-10-30

## 2019-09-21 ENCOUNTER — Encounter: Admit: 2019-09-21 | Discharge: 2019-09-21 | Payer: MEDICARE

## 2019-09-22 ENCOUNTER — Encounter: Admit: 2019-09-22 | Discharge: 2019-09-22 | Payer: MEDICARE

## 2019-09-30 ENCOUNTER — Ambulatory Visit: Admit: 2019-09-30 | Discharge: 2019-09-30 | Payer: MEDICARE

## 2019-09-30 ENCOUNTER — Encounter: Admit: 2019-09-30 | Discharge: 2019-09-30 | Payer: MEDICARE

## 2019-09-30 DIAGNOSIS — I4891 Unspecified atrial fibrillation: Secondary | ICD-10-CM

## 2019-09-30 DIAGNOSIS — I2510 Atherosclerotic heart disease of native coronary artery without angina pectoris: Secondary | ICD-10-CM

## 2019-09-30 DIAGNOSIS — I440 Atrioventricular block, first degree: Secondary | ICD-10-CM

## 2019-09-30 DIAGNOSIS — I10 Essential (primary) hypertension: Secondary | ICD-10-CM

## 2019-09-30 DIAGNOSIS — E785 Hyperlipidemia, unspecified: Secondary | ICD-10-CM

## 2019-09-30 DIAGNOSIS — I493 Ventricular premature depolarization: Secondary | ICD-10-CM

## 2019-09-30 DIAGNOSIS — K449 Diaphragmatic hernia without obstruction or gangrene: Secondary | ICD-10-CM

## 2019-09-30 DIAGNOSIS — Z9889 Other specified postprocedural states: Secondary | ICD-10-CM

## 2019-09-30 DIAGNOSIS — M4802 Spinal stenosis, cervical region: Secondary | ICD-10-CM

## 2019-09-30 DIAGNOSIS — G4733 Obstructive sleep apnea (adult) (pediatric): Secondary | ICD-10-CM

## 2019-09-30 DIAGNOSIS — I447 Left bundle-branch block, unspecified: Secondary | ICD-10-CM

## 2019-09-30 DIAGNOSIS — I429 Cardiomyopathy, unspecified: Secondary | ICD-10-CM

## 2019-09-30 DIAGNOSIS — E7800 Pure hypercholesterolemia, unspecified: Secondary | ICD-10-CM

## 2019-09-30 DIAGNOSIS — K929 Disease of digestive system, unspecified: Secondary | ICD-10-CM

## 2019-09-30 DIAGNOSIS — K759 Inflammatory liver disease, unspecified: Secondary | ICD-10-CM

## 2019-09-30 DIAGNOSIS — Z5181 Encounter for therapeutic drug level monitoring: Secondary | ICD-10-CM

## 2019-09-30 MED ORDER — LOSARTAN 25 MG PO TAB
25 mg | ORAL_TABLET | Freq: Every day | ORAL | 3 refills | 30.00000 days | Status: AC
Start: 2019-09-30 — End: ?

## 2019-09-30 MED ORDER — PERFLUTREN LIPID MICROSPHERES 1.1 MG/ML IV SUSP
1-20 mL | Freq: Once | INTRAVENOUS | 0 refills | Status: CP | PRN
Start: 2019-09-30 — End: ?

## 2019-09-30 MED ORDER — LOSARTAN 50 MG PO TAB
50 mg | ORAL_TABLET | Freq: Every day | ORAL | 3 refills | 90.00000 days | Status: AC
Start: 2019-09-30 — End: ?

## 2019-09-30 NOTE — Progress Notes
Date of Service: 09/30/2019    Crystal Savage is a 73 y.o. female.       HPI     [name] is doing well from a cardiac standpoint.  She is a 73 year old white female that has a history of cardiomyopathy, LBBB, frequent PVCs on a previous Holter monitor, she also developed atrial fibrillation and she underwent PVI on 08/11/2018.    Patient does not report symptoms of recurrent heart palpitations, she is currently doing well.  She has not experienced any lightheadedness, no dizziness no presyncope or syncope.    Patient does not have any known obstructive CAD.    In the past the ejection fraction was decreased, it was thought that that that was due to frequent PVCs, she was started on flecainide, the symptomatic heart palpitations have subsided, her ejection fraction has normalized.  We have continued her on flecainide 50 mg p.o. twice daily and due to a previous history of atrial fibrillation she also continues to remain anticoagulated with rivaroxaban.    An echocardiogram performed today demonstrated normal left ventricular systolic function, there are no valvular abnormalities.         Vitals:    09/30/19 1039 09/30/19 1118   BP: (!) 148/86 (!) 144/84   BP Source: Arm, Left Upper Arm, Right Upper   Pulse: 70    Temp: 36.8 ?C (98.3 ?F)    SpO2: 98%    Weight: 87.9 kg (193 lb 12.8 oz)    Height: 1.6 m (5' 3)    PainSc: Zero      Body mass index is 34.33 kg/m?Marland Kitchen     Past Medical History  Patient Active Problem List    Diagnosis Date Noted   ? Atrial fibrillation (HCC) 05/16/2018     08/11/2018 - [facility]:  Paroxysmal Atrial Fibrillation status post successful pulmonary vein antral isolation.  Cavotricuspid Isthmus Ablation  11/10/2018 - Zio Patch:  Normal sinus rhythm.  No evidence of atrial fibrillation.  No symptoms.  Occasional short PAT.     ? Heart palpitations 08/22/2017   ? Encounter for monitoring flecainide therapy 03/21/2017   ? 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 OSA (obstructive sleep apnea) 10/02/2016     Wearing CPAP consistently     ? LBBB (left bundle branch block) 08/26/2014   ? Abnormal cardiovascular function study 09/28/2010     10/02/10: Cardiac Cath: EF 40-45%. Mild plaquing to LM, LAD with 30% mid lesion, otherwise non obstructive dz.     ? Cardiomyopathy (HCC) 09/28/2010   ? 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 HTN (hypertension) 04/14/2009   ? Hyperlipidemia 04/14/2009   ? Under observation for suspected coronary artery disease 04/14/2009     Previous thallium dated 05/31/2005--negative for ischemia. Risk factors for coronary artery disease.            Review of Systems   Constitution: Positive for malaise/fatigue.   HENT: Positive for tinnitus.    Eyes: Negative.    Cardiovascular: Positive for dyspnea on exertion.   Respiratory: Negative.    Endocrine: Negative.    Hematologic/Lymphatic: Negative.    Skin: Negative.    Musculoskeletal: Positive for arthritis, back pain, joint pain and myalgias.   Gastrointestinal: Positive for constipation.   Genitourinary: Negative.    Neurological: Positive for numbness and paresthesias.   Psychiatric/Behavioral: Negative.    Allergic/Immunologic: Positive for environmental allergies.       Physical Exam  General Appearance: normal in appearance  Skin: warm,  moist, no ulcers or xanthomas  Eyes: conjunctivae and lids normal, pupils are equal and round  Lips & Oral Mucosa: no pallor or cyanosis  Neck Veins: neck veins are flat, neck veins are not distended  Chest Inspection: chest is normal in appearance  Respiratory Effort: breathing comfortably, no respiratory distress  Auscultation/Percussion: lungs clear to auscultation, no rales or rhonchi, no wheezing  Cardiac Rhythm: regular rhythm and normal rate  Cardiac Auscultation: S1, S2 normal, no rub, no gallop  Murmurs: no murmur  Carotid Arteries: normal carotid upstroke bilaterally, no bruit  Lower Extremity Edema: no lower extremity edema Abdominal Exam: soft, non-tender, no masses, bowel sounds normal  Liver & Spleen: no organomegaly  Language and Memory: patient responsive and seems to comprehend information  Neurologic Exam: neurological assessment grossly intact      Cardiovascular Studies      Problems Addressed Today  Encounter Diagnoses   Name Primary?   ? Essential hypertension Yes   ? Pure hypercholesterolemia    ? Cardiomyopathy, unspecified type (HCC)    ? Premature ventricular contractions (PVCs) (VPCs)    ? LBBB (left bundle branch block)    ? First degree AV block    ? Atrial fibrillation, unspecified type (HCC)    ? OSA (obstructive sleep apnea)    ? Encounter for monitoring flecainide therapy    ? 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    She does have an LBBB at baseline    She does not have any documented history of CAD.    Plan:    1.  Continue all current medications including anticoagulation with apixaban  2.  Patient will be further evaluated with a 14 days Holter monitor  3.  Follow-up office visit in 6 months         Current Medications (including today's revisions)  ? aspirin EC 81 mg tablet Take 81 mg by mouth at bedtime daily. Take with food.   ? atorvastatin (LIPITOR) 40 mg tablet TAKE 1 TABLET BY MOUTH EVERY DAY AT BEDTIME   ? carvediloL (COREG) 25 mg tablet Take one tablet by mouth twice daily with meals. Take with food.   ? cetirizine (ZYRTEC) 10 mg tablet Take 10 mg by mouth every morning.   ? cholecalciferol (VITAMIN D-3) 1,000 units tablet Take 1,000 Units by mouth daily.   ? ELIQUIS 5 mg tablet Take one tablet by mouth twice daily. ? flecainide (TAMBOCOR) 50 mg tablet Take one-half tablet by mouth twice daily.   ? fluticasone propionate (FLONASE) 50 mcg/actuation nasal spray Apply 2 sprays to each nostril as directed daily. Shake bottle gently before using.   ? hydroCHLOROthiazide (HYDRODIURIL) 25 mg tablet TAKE 1/2 (ONE-HALF) TABLET BY MOUTH ONCE DAILY IN THE MORNING   ? losartan (COZAAR) 25 mg tablet Take one tablet by mouth daily with dinner.   ? losartan (COZAAR) 50 mg tablet Take one tablet by mouth daily with breakfast.

## 2019-10-01 ENCOUNTER — Ambulatory Visit: Admit: 2019-10-01 | Discharge: 2019-10-01 | Payer: MEDICARE

## 2019-10-01 ENCOUNTER — Encounter: Admit: 2019-10-01 | Discharge: 2019-10-01 | Payer: MEDICARE

## 2019-10-01 DIAGNOSIS — I493 Ventricular premature depolarization: Secondary | ICD-10-CM

## 2019-10-01 DIAGNOSIS — I440 Atrioventricular block, first degree: Secondary | ICD-10-CM

## 2019-10-01 DIAGNOSIS — I429 Cardiomyopathy, unspecified: Secondary | ICD-10-CM

## 2019-10-01 DIAGNOSIS — E7800 Pure hypercholesterolemia, unspecified: Secondary | ICD-10-CM

## 2019-10-01 DIAGNOSIS — I10 Essential (primary) hypertension: Secondary | ICD-10-CM

## 2019-10-01 DIAGNOSIS — Z9889 Other specified postprocedural states: Secondary | ICD-10-CM

## 2019-10-01 DIAGNOSIS — I447 Left bundle-branch block, unspecified: Secondary | ICD-10-CM

## 2019-10-01 DIAGNOSIS — Z5181 Encounter for therapeutic drug level monitoring: Secondary | ICD-10-CM

## 2019-10-01 DIAGNOSIS — G4733 Obstructive sleep apnea (adult) (pediatric): Secondary | ICD-10-CM

## 2019-10-01 DIAGNOSIS — I4891 Unspecified atrial fibrillation: Secondary | ICD-10-CM

## 2019-10-01 NOTE — Progress Notes
Long Term Monitor Placement Record  Ordering Physician: MNH  Diagnosis: Palp  Length: 14 days  Serial Number: [id number]  Applied by: TB

## 2019-10-01 NOTE — Progress Notes
Online enrollment complete

## 2019-10-28 ENCOUNTER — Encounter: Admit: 2019-10-28 | Discharge: 2019-10-28 | Payer: MEDICARE

## 2019-10-30 ENCOUNTER — Encounter: Admit: 2019-10-30 | Discharge: 2019-10-30 | Payer: MEDICARE

## 2019-10-30 DIAGNOSIS — E782 Mixed hyperlipidemia: Secondary | ICD-10-CM

## 2019-10-30 DIAGNOSIS — I10 Essential (primary) hypertension: Secondary | ICD-10-CM

## 2019-10-30 MED ORDER — ATORVASTATIN 40 MG PO TAB
ORAL_TABLET | Freq: Every day | 3 refills | Status: DC
Start: 2019-10-30 — End: 2020-03-31

## 2019-12-17 ENCOUNTER — Encounter: Admit: 2019-12-17 | Discharge: 2019-12-17 | Payer: MEDICARE

## 2019-12-18 ENCOUNTER — Encounter: Admit: 2019-12-18 | Discharge: 2019-12-18 | Payer: MEDICARE

## 2019-12-18 DIAGNOSIS — I2510 Atherosclerotic heart disease of native coronary artery without angina pectoris: Secondary | ICD-10-CM

## 2019-12-18 DIAGNOSIS — E785 Hyperlipidemia, unspecified: Secondary | ICD-10-CM

## 2019-12-18 DIAGNOSIS — I10 Essential (primary) hypertension: Secondary | ICD-10-CM

## 2019-12-18 DIAGNOSIS — I440 Atrioventricular block, first degree: Secondary | ICD-10-CM

## 2019-12-18 DIAGNOSIS — K929 Disease of digestive system, unspecified: Secondary | ICD-10-CM

## 2019-12-18 DIAGNOSIS — K759 Inflammatory liver disease, unspecified: Secondary | ICD-10-CM

## 2019-12-18 DIAGNOSIS — M4802 Spinal stenosis, cervical region: Secondary | ICD-10-CM

## 2019-12-18 DIAGNOSIS — K449 Diaphragmatic hernia without obstruction or gangrene: Secondary | ICD-10-CM

## 2019-12-18 DIAGNOSIS — R0600 Dyspnea, unspecified: Secondary | ICD-10-CM

## 2019-12-21 ENCOUNTER — Encounter: Admit: 2019-12-21 | Discharge: 2019-12-21 | Payer: MEDICARE

## 2020-01-04 ENCOUNTER — Encounter: Admit: 2020-01-04 | Discharge: 2020-01-04 | Payer: MEDICARE

## 2020-01-04 NOTE — Telephone Encounter
Called patient with stress test instructions who gave verbal understanding. NPO the morning of the exam and no caffeine for the 24 hours prior.

## 2020-01-06 ENCOUNTER — Ambulatory Visit: Admit: 2020-01-06 | Discharge: 2020-01-06 | Payer: MEDICARE

## 2020-01-06 ENCOUNTER — Encounter: Admit: 2020-01-06 | Discharge: 2020-01-06 | Payer: MEDICARE

## 2020-01-06 DIAGNOSIS — R0600 Dyspnea, unspecified: Secondary | ICD-10-CM

## 2020-01-06 MED ORDER — EUCALYPTUS-MENTHOL MM LOZG
1 | Freq: Once | ORAL | 0 refills | Status: AC | PRN
Start: 2020-01-06 — End: ?

## 2020-01-06 MED ORDER — NITROGLYCERIN 0.4 MG SL SUBL
.4 mg | SUBLINGUAL | 0 refills | Status: DC | PRN
Start: 2020-01-06 — End: 2020-01-11

## 2020-01-06 MED ORDER — AMINOPHYLLINE 500 MG/20 ML IV SOLN
50 mg | INTRAVENOUS | 0 refills | Status: AC | PRN
Start: 2020-01-06 — End: ?

## 2020-01-06 MED ORDER — ALBUTEROL SULFATE 90 MCG/ACTUATION IN HFAA
2 | RESPIRATORY_TRACT | 0 refills | Status: DC | PRN
Start: 2020-01-06 — End: 2020-01-11

## 2020-01-06 MED ORDER — REGADENOSON 0.4 MG/5 ML IV SYRG
.4 mg | Freq: Once | INTRAVENOUS | 0 refills | Status: CP
Start: 2020-01-06 — End: ?

## 2020-01-06 MED ORDER — SODIUM CHLORIDE 0.9 % IV SOLP
250 mL | INTRAVENOUS | 0 refills | Status: AC | PRN
Start: 2020-01-06 — End: ?

## 2020-03-24 ENCOUNTER — Encounter: Admit: 2020-03-24 | Discharge: 2020-03-24 | Payer: MEDICARE

## 2020-03-31 ENCOUNTER — Encounter: Admit: 2020-03-31 | Discharge: 2020-03-31 | Payer: MEDICARE

## 2020-03-31 DIAGNOSIS — G4733 Obstructive sleep apnea (adult) (pediatric): Secondary | ICD-10-CM

## 2020-03-31 DIAGNOSIS — I447 Left bundle-branch block, unspecified: Secondary | ICD-10-CM

## 2020-03-31 DIAGNOSIS — K759 Inflammatory liver disease, unspecified: Secondary | ICD-10-CM

## 2020-03-31 DIAGNOSIS — U071 COVID-19 virus infection: Secondary | ICD-10-CM

## 2020-03-31 DIAGNOSIS — R002 Palpitations: Secondary | ICD-10-CM

## 2020-03-31 DIAGNOSIS — R9430 Abnormal result of cardiovascular function study, unspecified: Secondary | ICD-10-CM

## 2020-03-31 DIAGNOSIS — M4802 Spinal stenosis, cervical region: Secondary | ICD-10-CM

## 2020-03-31 DIAGNOSIS — I440 Atrioventricular block, first degree: Secondary | ICD-10-CM

## 2020-03-31 DIAGNOSIS — Z0389 Encounter for observation for other suspected diseases and conditions ruled out: Secondary | ICD-10-CM

## 2020-03-31 DIAGNOSIS — K929 Disease of digestive system, unspecified: Secondary | ICD-10-CM

## 2020-03-31 DIAGNOSIS — E785 Hyperlipidemia, unspecified: Secondary | ICD-10-CM

## 2020-03-31 DIAGNOSIS — K449 Diaphragmatic hernia without obstruction or gangrene: Secondary | ICD-10-CM

## 2020-03-31 DIAGNOSIS — Z5181 Encounter for therapeutic drug level monitoring: Secondary | ICD-10-CM

## 2020-03-31 DIAGNOSIS — I2510 Atherosclerotic heart disease of native coronary artery without angina pectoris: Secondary | ICD-10-CM

## 2020-03-31 DIAGNOSIS — E7800 Pure hypercholesterolemia, unspecified: Secondary | ICD-10-CM

## 2020-03-31 DIAGNOSIS — I493 Ventricular premature depolarization: Secondary | ICD-10-CM

## 2020-03-31 DIAGNOSIS — Z23 Encounter for immunization: Secondary | ICD-10-CM

## 2020-03-31 DIAGNOSIS — E782 Mixed hyperlipidemia: Secondary | ICD-10-CM

## 2020-03-31 DIAGNOSIS — I4891 Unspecified atrial fibrillation: Secondary | ICD-10-CM

## 2020-03-31 DIAGNOSIS — I10 Essential (primary) hypertension: Secondary | ICD-10-CM

## 2020-03-31 DIAGNOSIS — Z9889 Other specified postprocedural states: Secondary | ICD-10-CM

## 2020-03-31 MED ORDER — ATORVASTATIN 40 MG PO TAB
40 mg | ORAL_TABLET | Freq: Every evening | ORAL | 3 refills | Status: AC
Start: 2020-03-31 — End: ?

## 2020-03-31 MED ORDER — CARVEDILOL 25 MG PO TAB
25 mg | ORAL_TABLET | Freq: Two times a day (BID) | ORAL | 3 refills | 90.00000 days | Status: AC
Start: 2020-03-31 — End: ?

## 2020-03-31 MED ORDER — FLECAINIDE 50 MG PO TAB
25 mg | ORAL_TABLET | Freq: Two times a day (BID) | ORAL | 3 refills | 30.00000 days | Status: AC
Start: 2020-03-31 — End: ?

## 2020-03-31 MED ORDER — APIXABAN 5 MG PO TAB
5 mg | ORAL_TABLET | Freq: Two times a day (BID) | ORAL | 3 refills | Status: DC
Start: 2020-03-31 — End: 2020-06-06

## 2020-03-31 MED ORDER — HYDROCHLOROTHIAZIDE 25 MG PO TAB
ORAL_TABLET | Freq: Every day | ORAL | 3 refills | 28.00000 days | Status: AC
Start: 2020-03-31 — End: ?

## 2020-06-05 ENCOUNTER — Encounter: Admit: 2020-06-05 | Discharge: 2020-06-05 | Payer: MEDICARE

## 2020-06-06 MED ORDER — ELIQUIS 5 MG PO TAB
ORAL_TABLET | Freq: Two times a day (BID) | 3 refills | Status: AC
Start: 2020-06-06 — End: ?

## 2020-10-10 ENCOUNTER — Encounter: Admit: 2020-10-10 | Discharge: 2020-10-10 | Payer: MEDICARE

## 2020-10-10 MED ORDER — LOSARTAN 25 MG PO TAB
ORAL_TABLET | Freq: Every day | ORAL | 1 refills | 30.00000 days | Status: AC
Start: 2020-10-10 — End: ?

## 2020-10-10 MED ORDER — LOSARTAN 50 MG PO TAB
ORAL_TABLET | Freq: Every day | ORAL | 1 refills | 30.00000 days | Status: AC
Start: 2020-10-10 — End: ?

## 2020-11-24 ENCOUNTER — Encounter: Admit: 2020-11-24 | Discharge: 2020-11-24 | Payer: MEDICARE

## 2020-11-24 DIAGNOSIS — I440 Atrioventricular block, first degree: Secondary | ICD-10-CM

## 2020-11-24 DIAGNOSIS — U071 COVID-19 virus infection: Secondary | ICD-10-CM

## 2020-11-24 DIAGNOSIS — E7800 Pure hypercholesterolemia, unspecified: Secondary | ICD-10-CM

## 2020-11-24 DIAGNOSIS — I10 Essential (primary) hypertension: Secondary | ICD-10-CM

## 2020-11-24 DIAGNOSIS — I2510 Atherosclerotic heart disease of native coronary artery without angina pectoris: Secondary | ICD-10-CM

## 2020-11-24 DIAGNOSIS — G4733 Obstructive sleep apnea (adult) (pediatric): Secondary | ICD-10-CM

## 2020-11-24 DIAGNOSIS — R002 Palpitations: Secondary | ICD-10-CM

## 2020-11-24 DIAGNOSIS — Z23 Encounter for immunization: Secondary | ICD-10-CM

## 2020-11-24 DIAGNOSIS — K449 Diaphragmatic hernia without obstruction or gangrene: Secondary | ICD-10-CM

## 2020-11-24 DIAGNOSIS — Z9889 Other specified postprocedural states: Secondary | ICD-10-CM

## 2020-11-24 DIAGNOSIS — I429 Cardiomyopathy, unspecified: Secondary | ICD-10-CM

## 2020-11-24 DIAGNOSIS — M4802 Spinal stenosis, cervical region: Secondary | ICD-10-CM

## 2020-11-24 DIAGNOSIS — K929 Disease of digestive system, unspecified: Secondary | ICD-10-CM

## 2020-11-24 DIAGNOSIS — Z0389 Encounter for observation for other suspected diseases and conditions ruled out: Secondary | ICD-10-CM

## 2020-11-24 DIAGNOSIS — K759 Inflammatory liver disease, unspecified: Secondary | ICD-10-CM

## 2020-11-24 DIAGNOSIS — I447 Left bundle-branch block, unspecified: Secondary | ICD-10-CM

## 2020-11-24 DIAGNOSIS — I480 Paroxysmal atrial fibrillation: Secondary | ICD-10-CM

## 2020-11-24 DIAGNOSIS — E785 Hyperlipidemia, unspecified: Secondary | ICD-10-CM

## 2020-11-24 DIAGNOSIS — I493 Ventricular premature depolarization: Secondary | ICD-10-CM

## 2020-11-24 NOTE — Progress Notes
Date of Service: 11/24/2020    Crystal Savage is a 75 y.o. female.       HPI     Mrs. Crystal Savage is here today for a follow-up office visit.  She is a 75 year old white female, she was last seen in our office in May 2021.    Patient presents with the following cardiovascular/medical issues:  ? Paroxysmal atrial fibrillation?this was initially detected in September 2019, patient at that time did experience symptomatic heart palpitations while leaving work, she returned to the emergency room department here in [facility], she was found to be in atrial fibrillation.  She did undergo RFA in September 2019.  Follow-up Holter monitors performed in December 2019 and November 2020 did not demonstrate recurrent arrhythmia.  However, patient's CHA2DS2-VASc is 4, she has had a 4% yearly risk of stroke.  ? History of cardiomyopathy? this was probably PVC induced in the past, patient did experience symptomatic heart palpitations, in October 2011 she was evaluated with a Holter monitor and she had 30% PVCs of the total number of beats.  Patient's PVCs have been very well managed with class Ic antiarrhythmic drug therapy, patient continues to take flecainide 50 mg p.o. twice daily.  The ejection fraction is 58% by the last perfusion imaging study performed in February 2021, it was 50% by an echocardiogram performed in November 2020.  ? Hypertension?patient is on combination of an ARB and thiazide diuretic as well as carvedilol, her blood pressure is under good control  ? Hyperlipidemia?patient is on atorvastatin.    Patient is here today, she reports very rare episodes of heart palpitations.  These occur very rarely.  Patient thinks they are probably compatible with PVCs and not atrial fibrillation.  She has not experienced chest pain.  Patient is not known to have any obstructive coronary artery disease.         Vitals:    11/24/20 0919 11/24/20 0925   BP: 138/72 136/74   BP Source: Arm, Left Upper Arm, Right Upper   Patient Position: Sitting Sitting   Pulse: 72    SpO2: 96%    Weight: 85.9 kg (189 lb 6.4 oz)    Height: 1.6 m (5' 3)    PainSc: Zero      Body mass index is 33.55 kg/m?Marland Kitchen     Past Medical History  Patient Active Problem List    Diagnosis Date Noted   ? COVID-19 vaccine administered 03/31/2020   ? COVID-19 virus infection 03/31/2020   ? Status post radiofrequency ablation for arrhythmia 09/30/2019   ? Atrial fibrillation (HCC) 05/16/2018     08/11/2018 - [facility]:  Paroxysmal Atrial Fibrillation status post successful pulmonary vein antral isolation.  Cavotricuspid Isthmus Ablation  11/10/2018 - Zio Patch:  Normal sinus rhythm.  No evidence of atrial fibrillation.  No symptoms.  Occasional short PAT.  10/01/2019 - Zio Patch:  Normal sinus rhythm.  Isolated and rare episodes of nonsustained SVT, the longest episode lasted 10 beats.  Overall supraventricular ectopy burden was less than 1% of the total number of beats.  Rare ventricular ectopy, no VT, no bigeminy and no trigeminy, the total burden was less than 1% of the total number of beats.  No evidence of atrial fibrillation and no atrial flutter.     ? Heart palpitations 08/22/2017   ? Encounter for monitoring flecainide therapy 03/21/2017   ? 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 OSA (obstructive sleep apnea) 10/02/2016     Wearing CPAP consistently     ?  LBBB (left bundle branch block) 08/26/2014   ? Abnormal cardiovascular function study 09/28/2010     10/02/10: Cardiac Cath: EF 40-45%. Mild plaquing to LM, LAD with 30% mid lesion, otherwise non obstructive dz.     ? Cardiomyopathy (HCC) 09/28/2010     09/30/2019 - ECHO:  Borderline global LV hypokinesis with an estimated ejection fraction of 50%.  Normal RV size and function.  The atria are normal sized.  Normal valve morphology with no significant Doppler abnormality.     ? 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 HTN (hypertension) 04/14/2009   ? Hyperlipidemia 04/14/2009   ? Under observation for suspected coronary artery disease 04/14/2009     Previous thallium dated 05/31/2005--negative for ischemia. Risk factors for coronary artery disease.            Review of Systems   Constitutional: Negative.   HENT: Negative.    Eyes: Negative.    Cardiovascular: Negative.    Respiratory: Negative.    Endocrine: Negative.    Hematologic/Lymphatic: Negative.    Skin: Negative.    Musculoskeletal: Negative.    Gastrointestinal: Negative.    Genitourinary: Negative.    Neurological: Negative.    Psychiatric/Behavioral: Negative.    Allergic/Immunologic: Negative.        Physical Exam  General Appearance: normal in appearance  Skin: warm, moist, no ulcers or xanthomas  Eyes: conjunctivae and lids normal, pupils are equal and round  Lips & Oral Mucosa: no pallor or cyanosis  Neck Veins: neck veins are flat, neck veins are not distended  Chest Inspection: chest is normal in appearance  Respiratory Effort: breathing comfortably, no respiratory distress  Auscultation/Percussion: lungs clear to auscultation, no rales or rhonchi, no wheezing  Cardiac Rhythm: regular rhythm and normal rate  Cardiac Auscultation: S1, S2 normal, no rub, no gallop  Murmurs: no murmur  Carotid Arteries: normal carotid upstroke bilaterally, no bruit  Lower Extremity Edema: no lower extremity edema  Abdominal Exam: soft, non-tender, no masses, bowel sounds normal  Liver & Spleen: no organomegaly  Language and Memory: patient responsive and seems to comprehend information  Neurologic Exam: neurological assessment grossly intact      Cardiovascular Studies      Cardiovascular Health Factors  Vitals BP Readings from Last 3 Encounters:   11/24/20 136/74   03/31/20 (!) 148/82   12/18/19 138/80     Wt Readings from Last 3 Encounters:   11/24/20 85.9 kg (189 lb 6.4 oz)   03/31/20 89.9 kg (198 lb 3.2 oz)   12/18/19 87.5 kg (193 lb)     BMI Readings from Last 3 Encounters:   11/24/20 33.55 kg/m?   03/31/20 35.11 kg/m?   12/18/19 34.19 kg/m?      Smoking Social History     Tobacco Use   Smoking Status Never Smoker   Smokeless Tobacco Never Used      Lipid Profile Cholesterol   Date Value Ref Range Status   08/12/2019 147  Final     HDL   Date Value Ref Range Status   08/12/2019 45  Final     LDL   Date Value Ref Range Status   08/12/2019 72  Final     Triglycerides   Date Value Ref Range Status   08/12/2019 150  Final      Blood Sugar Hemoglobin A1C   Date Value Ref Range Status   08/13/2017 6.1  Final     Glucose   Date Value Ref  Range Status   08/12/2019 132 (H) 73 - 118 Final   10/31/2018 114 (H) 83 - 110 Final   08/12/2018 160 (H) 70 - 100 MG/DL Final   16/08/9603 540            Problems Addressed Today  Encounter Diagnoses   Name Primary?   ? Paroxysmal atrial fibrillation (HCC) Yes   ? Cardiomyopathy, unspecified type (HCC)    ? First degree AV block    ? Primary hypertension    ? Pure hypercholesterolemia    ? LBBB (left bundle branch block)    ? Premature ventricular contractions (PVCs) (VPCs)    ? Status post radiofrequency ablation for arrhythmia    ? Under observation for suspected coronary artery disease    ? COVID-19 vaccine administered    ? COVID-19 virus infection    ? OSA (obstructive sleep apnea)    ? Heart palpitations        Assessment and Plan     In summary: This is a 75 year old white female that presents with following    1.  History of atrial fibrillation and atrial flutter?patient did undergo PVI in September 2019, follow-up Holter monitor studies in December 2019 and November 2020 did not demonstrate recurrent arrhythmias.  Patient's CHA2DS2-VASc score is 4, this translates into a 4% yearly incidence of stroke.  2.  History of high burden PVCs?this was diagnosed on a Holter monitor performed in October 2011, at that time her burden was approximately 30% PVCs of the total number of beats.  Patient's PVCs burden has been managed with a combination of beta-blocker and class Ic antiarrhythmic drug therapy, she has been tolerating that well and continues to remain for the most part asymptomatic  3.  Hypertension? this is under good control with the current combination of blunt beta-blocker, ARB and thiazide diuretic  4.  Obstructive sleep apnea?patient does use a CPAP machine  5.  Known underlying LBBB  6.  History of COVID-19 upper respiratory tract infection with subsequent hypoxia?patient did not undergo COVID-19 vaccine administration    Plan:    1.  Continue all current medications  2.  I did asked the patient to check the price on rivaroxaban, it is very feasible to change to this factor Xa inhibitor if cheaper than apixaban  3.  Continue risk factors modification  4.  Follow-up office visit in 6 months    Total Time Today was 30 minutes in the following activities: Preparing to see the patient, Obtaining and/or reviewing separately obtained history, Performing a medically appropriate examination and/or evaluation, Counseling and educating the patient/family/caregiver, Referring and communication with other health care professionals (when not separately reported), Documenting clinical information in the electronic or other health record, Independently interpreting results (not separately reported) and communicating results to the patient/family/caregiver and Care coordination (not separately reported)           Current Medications (including today's revisions)  ? acetaminophen SR (TYLENOL) 650 mg tablet Take 650 mg by mouth every 6 hours as needed for Pain.   ? alendronate (FOSAMAX) 70 mg tablet Take 70 mg by mouth every 7 days.   ? aspirin EC 81 mg tablet Take 81 mg by mouth at bedtime daily. Take with food.   ? atorvastatin (LIPITOR) 40 mg tablet Take one tablet by mouth at bedtime daily.   ? carvediloL (COREG) 25 mg tablet Take one tablet by mouth twice daily with meals. Take with food.   ? cetirizine (ZYRTEC) 10 mg  tablet Take 10 mg by mouth every morning.   ? cholecalciferol (VITAMIN D-3) 1,000 units tablet Take 1,000 Units by mouth daily.   ? ELIQUIS 5 mg tablet Take 1 tablet by mouth twice daily   ? flecainide (TAMBOCOR) 50 mg tablet Take one-half tablet by mouth twice daily.   ? fluticasone propionate (FLONASE) 50 mcg/actuation nasal spray Apply 2 sprays to each nostril as directed daily. Shake bottle gently before using.   ? hydroCHLOROthiazide (HYDRODIURIL) 25 mg tablet TAKE 1/2 (ONE-HALF) TABLET BY MOUTH ONCE DAILY IN THE MORNING   ? losartan (COZAAR) 25 mg tablet TAKE 1 TABLET BY MOUTH ONCE DAILY WITH  DINNER   ? losartan (COZAAR) 50 mg tablet Take 1 tablet by mouth once daily with breakfast   ? melatonin 3 mg tab Take 3 mg by mouth at bedtime as needed.   ? vit A/vit C/vit E/zinc/copper (PRESERVISION AREDS PO) Take  by mouth.   ? ZINC PO Take  by mouth.

## 2020-12-22 NOTE — Telephone Encounter
Patient called to let us know she has decided to continue taking Eliquis versus changing to xarelto. Patient recently had an office visit with Dr. Avie Arenas in which they discussed changing her to xarelto if it is more affordable. Patient states she has checked with her pharmacy and insurance and it will be better for her to continue Eliquis at this time.

## 2021-04-05 ENCOUNTER — Encounter: Admit: 2021-04-05 | Discharge: 2021-04-05 | Payer: MEDICARE

## 2021-04-05 MED ORDER — CARVEDILOL 25 MG PO TAB
ORAL_TABLET | Freq: Two times a day (BID) | ORAL | 3 refills | 90.00000 days | Status: AC
Start: 2021-04-05 — End: ?

## 2021-04-05 MED ORDER — LOSARTAN 50 MG PO TAB
ORAL_TABLET | Freq: Every day | ORAL | 3 refills | 30.00000 days | Status: AC
Start: 2021-04-05 — End: ?

## 2021-04-05 MED ORDER — FLECAINIDE 50 MG PO TAB
25 mg | ORAL_TABLET | Freq: Two times a day (BID) | ORAL | 3 refills | 30.00000 days | Status: AC
Start: 2021-04-05 — End: ?

## 2021-04-05 MED ORDER — LOSARTAN 25 MG PO TAB
ORAL_TABLET | Freq: Every day | ORAL | 3 refills | 90.00000 days | Status: AC
Start: 2021-04-05 — End: ?

## 2021-04-28 ENCOUNTER — Encounter: Admit: 2021-04-28 | Discharge: 2021-04-28 | Payer: MEDICARE

## 2021-04-28 DIAGNOSIS — E782 Mixed hyperlipidemia: Secondary | ICD-10-CM

## 2021-04-28 DIAGNOSIS — I10 Essential (primary) hypertension: Secondary | ICD-10-CM

## 2021-04-28 MED ORDER — ATORVASTATIN 40 MG PO TAB
ORAL_TABLET | Freq: Every day | 3 refills | Status: AC
Start: 2021-04-28 — End: ?

## 2021-06-14 ENCOUNTER — Encounter: Admit: 2021-06-14 | Discharge: 2021-06-14 | Payer: MEDICARE

## 2021-06-14 DIAGNOSIS — I10 Essential (primary) hypertension: Secondary | ICD-10-CM

## 2021-06-14 MED ORDER — HYDROCHLOROTHIAZIDE 25 MG PO TAB
ORAL_TABLET | Freq: Every day | ORAL | 3 refills | 28.00000 days | Status: AC
Start: 2021-06-14 — End: ?

## 2021-06-14 MED ORDER — ELIQUIS 5 MG PO TAB
ORAL_TABLET | Freq: Two times a day (BID) | 0 refills | Status: AC
Start: 2021-06-14 — End: ?

## 2021-09-11 ENCOUNTER — Encounter: Admit: 2021-09-11 | Discharge: 2021-09-11 | Payer: MEDICARE

## 2021-09-11 MED ORDER — ELIQUIS 5 MG PO TAB
ORAL_TABLET | Freq: Two times a day (BID) | 3 refills | Status: AC
Start: 2021-09-11 — End: ?

## 2022-02-09 ENCOUNTER — Encounter: Admit: 2022-02-09 | Discharge: 2022-02-09 | Payer: MEDICARE

## 2022-02-27 ENCOUNTER — Encounter: Admit: 2022-02-27 | Discharge: 2022-02-27 | Payer: MEDICARE

## 2022-02-27 ENCOUNTER — Ambulatory Visit: Admit: 2022-02-27 | Discharge: 2022-02-28 | Payer: MEDICARE

## 2022-02-27 DIAGNOSIS — I493 Ventricular premature depolarization: Secondary | ICD-10-CM

## 2022-02-27 DIAGNOSIS — Z5181 Encounter for therapeutic drug level monitoring: Secondary | ICD-10-CM

## 2022-02-27 DIAGNOSIS — K449 Diaphragmatic hernia without obstruction or gangrene: Secondary | ICD-10-CM

## 2022-02-27 DIAGNOSIS — E785 Hyperlipidemia, unspecified: Secondary | ICD-10-CM

## 2022-02-27 DIAGNOSIS — Z0389 Encounter for observation for other suspected diseases and conditions ruled out: Secondary | ICD-10-CM

## 2022-02-27 DIAGNOSIS — I10 Essential (primary) hypertension: Secondary | ICD-10-CM

## 2022-02-27 DIAGNOSIS — I447 Left bundle-branch block, unspecified: Secondary | ICD-10-CM

## 2022-02-27 DIAGNOSIS — I429 Cardiomyopathy, unspecified: Secondary | ICD-10-CM

## 2022-02-27 DIAGNOSIS — M4802 Spinal stenosis, cervical region: Secondary | ICD-10-CM

## 2022-02-27 DIAGNOSIS — I480 Paroxysmal atrial fibrillation: Secondary | ICD-10-CM

## 2022-02-27 DIAGNOSIS — E7800 Pure hypercholesterolemia, unspecified: Secondary | ICD-10-CM

## 2022-02-27 DIAGNOSIS — I2510 Atherosclerotic heart disease of native coronary artery without angina pectoris: Secondary | ICD-10-CM

## 2022-02-27 DIAGNOSIS — G4733 Obstructive sleep apnea (adult) (pediatric): Secondary | ICD-10-CM

## 2022-02-27 DIAGNOSIS — K759 Inflammatory liver disease, unspecified: Secondary | ICD-10-CM

## 2022-02-27 DIAGNOSIS — Z136 Encounter for screening for cardiovascular disorders: Secondary | ICD-10-CM

## 2022-02-27 DIAGNOSIS — R002 Palpitations: Secondary | ICD-10-CM

## 2022-02-27 DIAGNOSIS — Z9889 Other specified postprocedural states: Secondary | ICD-10-CM

## 2022-02-27 DIAGNOSIS — K929 Disease of digestive system, unspecified: Secondary | ICD-10-CM

## 2022-02-27 DIAGNOSIS — I440 Atrioventricular block, first degree: Secondary | ICD-10-CM

## 2022-02-27 NOTE — Progress Notes
Date of Service: 02/27/2022    Crystal Savage is a 76 y.o. female.       HPI     Crystal Savage is a 76 y.o. 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    Patient does not report having symptoms of chest pain, no heart palpitations, she has not taken any sublingual nitroglycerin.    Her blood pressure is mildly increased today, we did record 144/72 mmHg, patient states that she did eat some salty foods couple of days ago.         Vitals:    02/27/22 1430   BP: (!) 144/72   BP Source: Arm, Left Upper   Pulse: 76   SpO2: 95%   O2 Device: None (Room air)   PainSc: Zero   Weight: 88.5 kg (195 lb 3.2 oz)   Height: 160 cm (5' 3)     Body mass index is 34.58 kg/m?Marland Kitchen     Past Medical History  Patient Active Problem List    Diagnosis Date Noted   ? COVID-19 vaccine administered 03/31/2020   ? COVID-19 virus infection 03/31/2020   ? Status post radiofrequency ablation for arrhythmia 09/30/2019   ? Atrial fibrillation (HCC) 05/16/2018     08/11/2018 - [facility]:  Paroxysmal Atrial Fibrillation status post successful pulmonary vein antral isolation.  Cavotricuspid Isthmus Ablation  11/10/2018 - [facility]:  Normal sinus rhythm.  No evidence of atrial fibrillation.  No symptoms.  Occasional short PAT.  10/01/2019 - [facility]:  Normal sinus rhythm.  Isolated and rare episodes of nonsustained SVT, the longest episode lasted 10 beats.  Overall supraventricular ectopy burden was less than 1% of the total number of beats.  Rare ventricular ectopy, no VT, no bigeminy and no trigeminy, the total burden was less than 1% of the total number of beats.  No evidence of atrial fibrillation and no atrial flutter.     ? Heart palpitations 08/22/2017   ? Encounter for monitoring flecainide therapy 03/21/2017   ? 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 OSA (obstructive sleep apnea) 10/02/2016     Wearing CPAP consistently     ? LBBB (left bundle branch block) 08/26/2014   ? Abnormal cardiovascular function study 09/28/2010     10/02/10: Cardiac Cath: EF 40-45%. Mild plaquing to LM, LAD with 30% mid lesion, otherwise non obstructive dz.     ? Cardiomyopathy (HCC) 09/28/2010     09/30/2019 - ECHO:  Borderline global LV hypokinesis with an estimated ejection fraction of 50%.  Normal RV size and function.  The atria are normal sized.  Normal valve morphology with no significant Doppler abnormality.     ? 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 HTN (hypertension) 04/14/2009   ? Hyperlipidemia 04/14/2009   ? Under observation for suspected coronary artery disease 04/14/2009     Previous thallium dated 05/31/2005--negative for ischemia. Risk factors for coronary artery disease.            Review of Systems   Constitutional: Negative.   HENT:  Negative.    Eyes: Negative.    Cardiovascular: Positive for leg swelling.   Respiratory: Negative.    Endocrine: Negative.    Hematologic/Lymphatic: Negative.    Skin: Positive for skin cancer.   Musculoskeletal: Negative.    Gastrointestinal: Negative.    Genitourinary: Negative.    Neurological: Negative.    Psychiatric/Behavioral: Negative.    Allergic/Immunologic: Negative.        Physical Exam  General Appearance: normal in appearance  Skin: warm, moist, no ulcers or xanthomas  Eyes: conjunctivae and lids normal, pupils are equal and round  Lips & Oral Mucosa: no pallor or cyanosis  Neck Veins: neck veins are flat, neck veins are not distended  Chest Inspection: chest is normal in appearance  Respiratory Effort: breathing comfortably, no respiratory distress  Auscultation/Percussion: lungs clear to auscultation, no rales or rhonchi, no wheezing  Cardiac Rhythm: regular rhythm and normal rate  Cardiac Auscultation: S1, S2 normal, no rub, no gallop  Murmurs: no murmur  Carotid Arteries: normal carotid upstroke bilaterally, no bruit  Lower Extremity Edema: no lower extremity edema  Abdominal Exam: soft, non-tender, no masses, bowel sounds normal  Liver & Spleen: no organomegaly  Language and Memory: patient responsive and seems to comprehend information  Neurologic Exam: neurological assessment grossly intact      Cardiovascular Studies  Twelve-lead EKG demonstrates normal sinus rhythm, nonspecific IVCD, QRS with 128 ms, nonspecific ST segment T wave changes.    Cardiovascular Health Factors  Vitals BP Readings from Last 3 Encounters:   02/27/22 (!) 144/72   11/24/20 136/74   03/31/20 (!) 148/82     Wt Readings from Last 3 Encounters:   02/27/22 88.5 kg (195 lb 3.2 oz)   11/24/20 85.9 kg (189 lb 6.4 oz)   03/31/20 89.9 kg (198 lb 3.2 oz)     BMI Readings from Last 3 Encounters:   02/27/22 34.58 kg/m?   11/24/20 33.55 kg/m?   03/31/20 35.11 kg/m?      Smoking Social History     Tobacco Use   Smoking Status Never   Smokeless Tobacco Never      Lipid Profile Cholesterol   Date Value Ref Range Status   09/13/2021 144  Final     HDL   Date Value Ref Range Status   09/13/2021 42  Final     LDL   Date Value Ref Range Status   09/13/2021 74  Final     Triglycerides   Date Value Ref Range Status   09/13/2021 141  Final      Blood Sugar Hemoglobin A1C   Date Value Ref Range Status   08/13/2017 6.1  Final     Glucose   Date Value Ref Range Status   09/13/2021 124 (H) 70 - 105 Final   08/12/2019 132 (H) 73 - 118 Final   10/31/2018 114 (H) 83 - 110 Final   03/18/2009 119            Problems Addressed Today  Encounter Diagnoses   Name Primary?   ? Paroxysmal atrial fibrillation (HCC) Yes   ? Cardiomyopathy, unspecified type (HCC)    ? First degree AV block    ? Primary hypertension    ? Pure hypercholesterolemia    ? LBBB (left bundle branch block)    ? Premature ventricular contractions (PVCs) (VPCs)    ? Status post radiofrequency ablation for arrhythmia    ? Under observation for suspected coronary artery disease    ?  Encounter for monitoring flecainide therapy    ? Heart palpitations    ? OSA (obstructive sleep apnea)    ? Screening for heart disease        Assessment and Plan     Assessment:     1.  Paroxysmal atrial fibrillation and atrial flutter  ? This was diagnosed in September 2019  2.  Status post pulmonary vein isolation-patient has not experienced any significant recurrent episodes  ? 3.  History of high burden PVC  ? This was diagnosed on a Holter monitor performed in October 2011 at that time the PVC burden was approximately 30%  ? This was managed with a combination of beta-blocker and class Ic antiarrhythmic drug therapy, patient has not had any significant recurrent events  ? The most recent long-term monitor performed in November 2020 demonstrated normal sinus rhythm, rare episodes of SVT and SVT, rare ventricular ectopy representing less than 1% of the total number of beats  4.  Status post left heart catheterization, known nonobstructive CAD  5.  Primary hypertension-borderline elevated today, patient states that she probably had salty diet couple of days ago  6.  Known left bundle branch block, IVCD  7.  OSA-patient does use a CPAP machine  8.  History of COVID-19 upper respiratory tract infection with subsequent hypoxia- patient is recovered    Plan:    1.  Continue all current medications  2.  Continue risk factors modifications and continue a strict low-sodium diet  3.  Evaluation with a 2D echo Doppler study-we will call with the results  4.  Follow-up office visit in 6 months    Total Time Today was 40 minutes in the following activities: Preparing to see the patient, Obtaining and/or reviewing separately obtained history, Performing a medically appropriate examination and/or evaluation, Counseling and educating the patient/family/caregiver, Ordering medications, tests, or procedures, Referring and communication with other health care professionals (when not separately reported), Documenting clinical information in the electronic or other health record, Independently interpreting results (not separately reported) and communicating results to the patient/family/caregiver and Care coordination (not separately reported)         Current Medications (including today's revisions)  ? acetaminophen SR (TYLENOL) 650 mg tablet Take one tablet by mouth every 6 hours as needed for Pain.   ? alendronate (FOSAMAX) 70 mg tablet Take one tablet by mouth every 7 days.   ? aspirin EC 81 mg tablet Take one tablet by mouth at bedtime daily. Take with food.   ? atorvastatin (LIPITOR) 40 mg tablet TAKE 1 TABLET BY MOUTH EVERY DAY AT BEDTIME   ? carvediloL (COREG) 25 mg tablet TAKE 1 TABLET BY MOUTH TWICE DAILY WITH MEALS   ? cetirizine (ZYRTEC) 10 mg tablet Take one tablet by mouth every morning.   ? cholecalciferol (VITAMIN D-3) 1,000 units tablet Take one tablet by mouth daily.   ? ELIQUIS 5 mg tablet Take 1 tablet by mouth twice daily   ? flecainide (TAMBOCOR) 50 mg tablet Take 1/2 (one-half) tablet by mouth twice daily   ? fluorouraciL (EFUDEX) 5 % soln APPLY SOLUTION TOPICALLY TO AFFECTED AREA OF SCALP TWICE DAILY FOR TWO WEEKS   ? fluticasone propionate (FLONASE) 50 mcg/actuation nasal spray Apply two sprays to each nostril as directed daily. Shake bottle gently before using.   ? hydroCHLOROthiazide (HYDRODIURIL) 25 mg tablet TAKE 1/2 (ONE-HALF) TABLET BY MOUTH ONCE DAILY IN THE MORNING   ? losartan (COZAAR) 25 mg tablet TAKE 1 TABLET BY MOUTH  ONCE DAILY WITH DINNER   ? losartan (COZAAR) 50 mg tablet Take 1 tablet by mouth once daily with breakfast   ? melatonin 3 mg tab Take one tablet by mouth at bedtime as needed.   ? triamcinolone acetonide 0.5 % ointment APPLY OINTMENT TOPICALLY THREE TIMES DAILY   ? vit A/vit C/vit E/zinc/copper (PRESERVISION AREDS PO) Take  by mouth.   ? ZINC PO Take  by mouth.

## 2022-03-08 ENCOUNTER — Ambulatory Visit: Admit: 2022-03-08 | Discharge: 2022-03-08 | Payer: MEDICARE

## 2022-03-08 ENCOUNTER — Encounter: Admit: 2022-03-08 | Discharge: 2022-03-08 | Payer: MEDICARE

## 2022-03-08 DIAGNOSIS — I10 Essential (primary) hypertension: Secondary | ICD-10-CM

## 2022-03-08 DIAGNOSIS — I4891 Unspecified atrial fibrillation: Secondary | ICD-10-CM

## 2022-03-08 DIAGNOSIS — I429 Cardiomyopathy, unspecified: Secondary | ICD-10-CM

## 2022-03-19 ENCOUNTER — Encounter: Admit: 2022-03-19 | Discharge: 2022-03-19 | Payer: MEDICARE

## 2022-03-19 NOTE — Telephone Encounter
-----   Message from Dorris Fetch, MD sent at 03/17/2022  5:52 PM CDT -----  Please let the patient know that the echocardiogram demonstrated normal heart function, no abnormalities of the heart valves.    Thank you      ----- Message -----  From: Harley Alto, MD  Sent: 03/12/2022  10:00 AM CDT  To: Dorris Fetch, MD

## 2022-04-02 ENCOUNTER — Encounter: Admit: 2022-04-02 | Discharge: 2022-04-02 | Payer: MEDICARE

## 2022-04-02 MED ORDER — FLECAINIDE 50 MG PO TAB
25 mg | ORAL_TABLET | Freq: Two times a day (BID) | ORAL | 3 refills | 30.00000 days | Status: AC
Start: 2022-04-02 — End: ?

## 2022-04-10 ENCOUNTER — Encounter: Admit: 2022-04-10 | Discharge: 2022-04-10 | Payer: MEDICARE

## 2022-04-25 ENCOUNTER — Encounter: Admit: 2022-04-25 | Discharge: 2022-04-25 | Payer: MEDICARE

## 2022-04-25 DIAGNOSIS — E782 Mixed hyperlipidemia: Secondary | ICD-10-CM

## 2022-04-25 DIAGNOSIS — I10 Essential (primary) hypertension: Secondary | ICD-10-CM

## 2022-04-25 MED ORDER — ATORVASTATIN 40 MG PO TAB
ORAL_TABLET | 3 refills | Status: AC
Start: 2022-04-25 — End: ?

## 2022-04-25 MED ORDER — CARVEDILOL 25 MG PO TAB
ORAL_TABLET | ORAL | 3 refills | 90.00000 days | Status: AC
Start: 2022-04-25 — End: ?

## 2022-04-25 MED ORDER — LOSARTAN 25 MG PO TAB
ORAL_TABLET | ORAL | 3 refills | 90.00000 days | Status: AC
Start: 2022-04-25 — End: ?

## 2022-04-25 MED ORDER — LOSARTAN 50 MG PO TAB
ORAL_TABLET | ORAL | 3 refills | 90.00000 days | Status: AC
Start: 2022-04-25 — End: ?

## 2022-06-10 ENCOUNTER — Encounter: Admit: 2022-06-10 | Discharge: 2022-06-10 | Payer: MEDICARE

## 2022-06-10 DIAGNOSIS — I10 Essential (primary) hypertension: Secondary | ICD-10-CM

## 2022-06-10 MED ORDER — HYDROCHLOROTHIAZIDE 25 MG PO TAB
ORAL_TABLET | 0 refills
Start: 2022-06-10 — End: ?

## 2022-06-11 ENCOUNTER — Encounter: Admit: 2022-06-11 | Discharge: 2022-06-11 | Payer: MEDICARE

## 2022-07-13 ENCOUNTER — Encounter: Admit: 2022-07-13 | Discharge: 2022-07-13 | Payer: MEDICARE

## 2022-07-13 NOTE — Telephone Encounter
Received call from Irving Burton, RN with Dr. Isaac Bliss office (ph. [phone], fax [phone]) requesting approval to hold Eliquis for 2 days prior to colonoscopy scheduled 10/23.      Pt is on Eliquis for atrial fibrillation, CHADSVASC score is 4 for age, gender and hypertension.    Will discuss with [facility] and fax recommendations to Dr. Isaac Bliss office.

## 2022-08-16 ENCOUNTER — Encounter: Admit: 2022-08-16 | Discharge: 2022-08-16 | Payer: MEDICARE

## 2022-08-16 DIAGNOSIS — I493 Ventricular premature depolarization: Secondary | ICD-10-CM

## 2022-08-16 DIAGNOSIS — Z23 Encounter for immunization: Secondary | ICD-10-CM

## 2022-08-16 DIAGNOSIS — Z0389 Encounter for observation for other suspected diseases and conditions ruled out: Secondary | ICD-10-CM

## 2022-08-16 DIAGNOSIS — E785 Hyperlipidemia, unspecified: Secondary | ICD-10-CM

## 2022-08-16 DIAGNOSIS — I480 Paroxysmal atrial fibrillation: Secondary | ICD-10-CM

## 2022-08-16 DIAGNOSIS — U071 COVID-19 virus infection: Secondary | ICD-10-CM

## 2022-08-16 DIAGNOSIS — Z5181 Encounter for therapeutic drug level monitoring: Secondary | ICD-10-CM

## 2022-08-16 DIAGNOSIS — K759 Inflammatory liver disease, unspecified: Secondary | ICD-10-CM

## 2022-08-16 DIAGNOSIS — K929 Disease of digestive system, unspecified: Secondary | ICD-10-CM

## 2022-08-16 DIAGNOSIS — E7800 Pure hypercholesterolemia, unspecified: Secondary | ICD-10-CM

## 2022-08-16 DIAGNOSIS — I2510 Atherosclerotic heart disease of native coronary artery without angina pectoris: Secondary | ICD-10-CM

## 2022-08-16 DIAGNOSIS — I10 Essential (primary) hypertension: Secondary | ICD-10-CM

## 2022-08-16 DIAGNOSIS — I440 Atrioventricular block, first degree: Secondary | ICD-10-CM

## 2022-08-16 DIAGNOSIS — K449 Diaphragmatic hernia without obstruction or gangrene: Secondary | ICD-10-CM

## 2022-08-16 DIAGNOSIS — I429 Cardiomyopathy, unspecified: Secondary | ICD-10-CM

## 2022-08-16 DIAGNOSIS — R002 Palpitations: Secondary | ICD-10-CM

## 2022-08-16 DIAGNOSIS — Z9889 Other specified postprocedural states: Secondary | ICD-10-CM

## 2022-08-16 DIAGNOSIS — I447 Left bundle-branch block, unspecified: Secondary | ICD-10-CM

## 2022-08-16 DIAGNOSIS — G4733 Obstructive sleep apnea (adult) (pediatric): Secondary | ICD-10-CM

## 2022-08-16 DIAGNOSIS — M4802 Spinal stenosis, cervical region: Secondary | ICD-10-CM

## 2022-09-26 ENCOUNTER — Encounter: Admit: 2022-09-26 | Discharge: 2022-09-26 | Payer: MEDICARE

## 2022-09-26 MED ORDER — ELIQUIS 5 MG PO TAB
ORAL_TABLET | 0 refills | Status: AC
Start: 2022-09-26 — End: ?

## 2022-12-17 ENCOUNTER — Encounter: Admit: 2022-12-17 | Discharge: 2022-12-17 | Payer: MEDICARE

## 2022-12-17 DIAGNOSIS — I10 Essential (primary) hypertension: Secondary | ICD-10-CM

## 2022-12-17 MED ORDER — HYDROCHLOROTHIAZIDE 25 MG PO TAB
ORAL_TABLET | ORAL | 3 refills | 28.00000 days | Status: AC
Start: 2022-12-17 — End: ?

## 2022-12-31 ENCOUNTER — Encounter: Admit: 2022-12-31 | Discharge: 2022-12-31 | Payer: MEDICARE

## 2022-12-31 MED ORDER — ELIQUIS 5 MG PO TAB
ORAL_TABLET | 3 refills | Status: AC
Start: 2022-12-31 — End: ?

## 2023-03-24 ENCOUNTER — Encounter: Admit: 2023-03-24 | Discharge: 2023-03-24 | Payer: MEDICARE

## 2023-03-24 MED ORDER — FLECAINIDE 50 MG PO TAB
25 mg | ORAL_TABLET | Freq: Two times a day (BID) | ORAL | 0 refills
Start: 2023-03-24 — End: ?

## 2023-03-25 ENCOUNTER — Encounter: Admit: 2023-03-25 | Discharge: 2023-03-25 | Payer: MEDICARE

## 2023-04-21 ENCOUNTER — Encounter: Admit: 2023-04-21 | Discharge: 2023-04-21 | Payer: MEDICARE

## 2023-04-21 DIAGNOSIS — I10 Essential (primary) hypertension: Secondary | ICD-10-CM

## 2023-04-21 DIAGNOSIS — E782 Mixed hyperlipidemia: Secondary | ICD-10-CM

## 2023-04-21 MED ORDER — LOSARTAN 25 MG PO TAB
ORAL_TABLET | 0 refills
Start: 2023-04-21 — End: ?

## 2023-04-21 MED ORDER — CARVEDILOL 25 MG PO TAB
ORAL_TABLET | 0 refills
Start: 2023-04-21 — End: ?

## 2023-04-21 MED ORDER — ATORVASTATIN 40 MG PO TAB
ORAL_TABLET | 0 refills
Start: 2023-04-21 — End: ?

## 2023-05-16 IMAGING — US ECHOCOMPL
1 series · 13 of 24 positions shown · non-contrast
Comparison: none

[Series 1: us echo 2d, complete · 43 acquisitions, 13 frames shown]
[im 1/43]
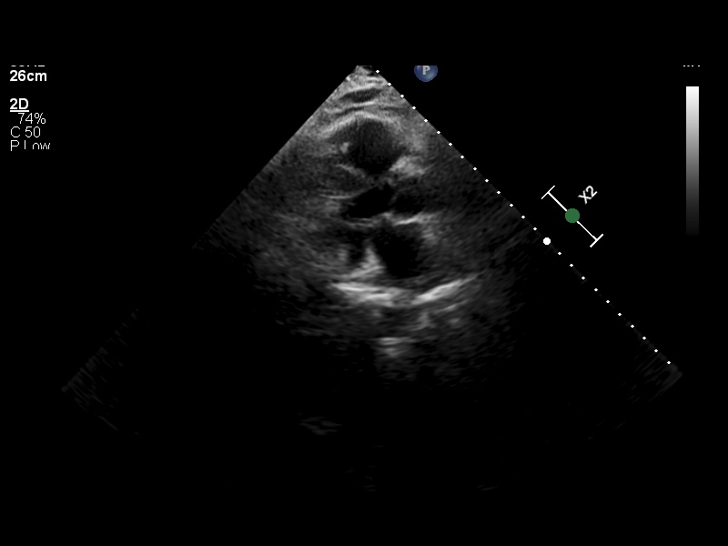
[im 2/43]
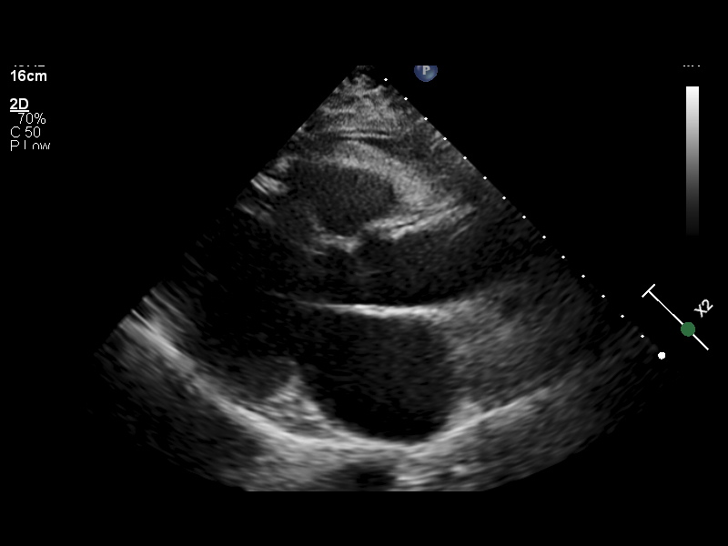
[im 6/43]
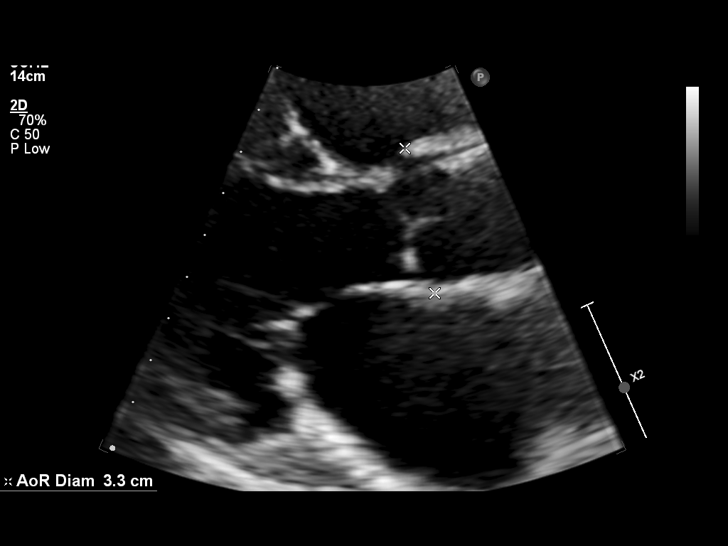
[im 11/43]
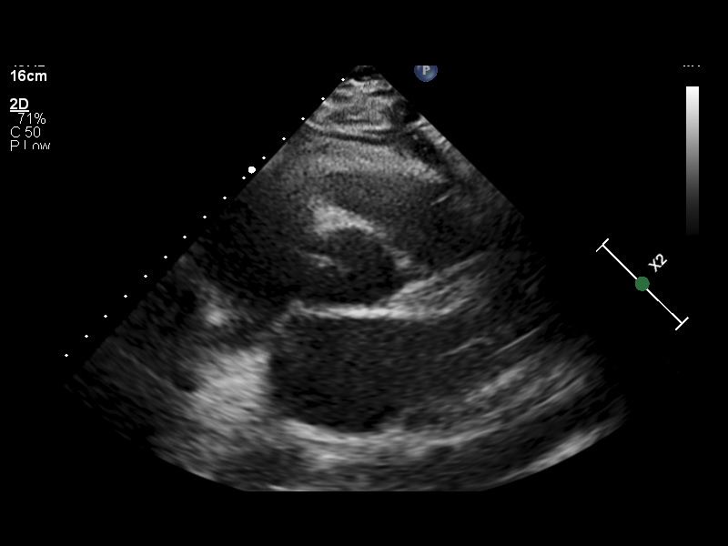
[im 15/43]
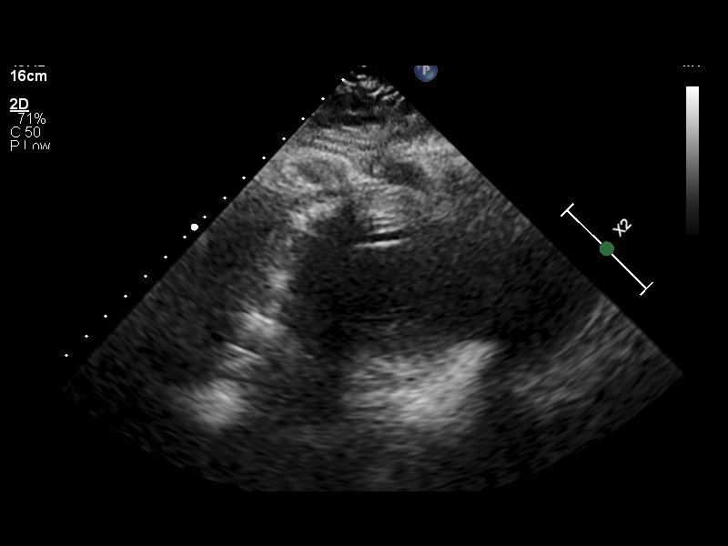
[im 17/43]
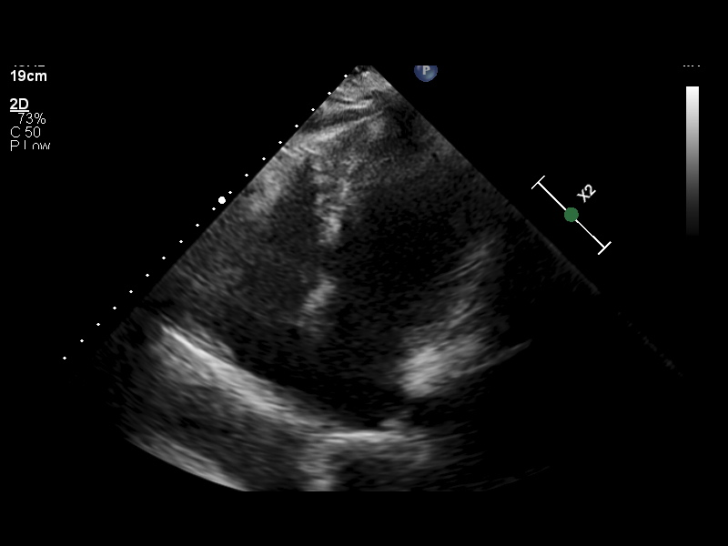
[im 21/43]
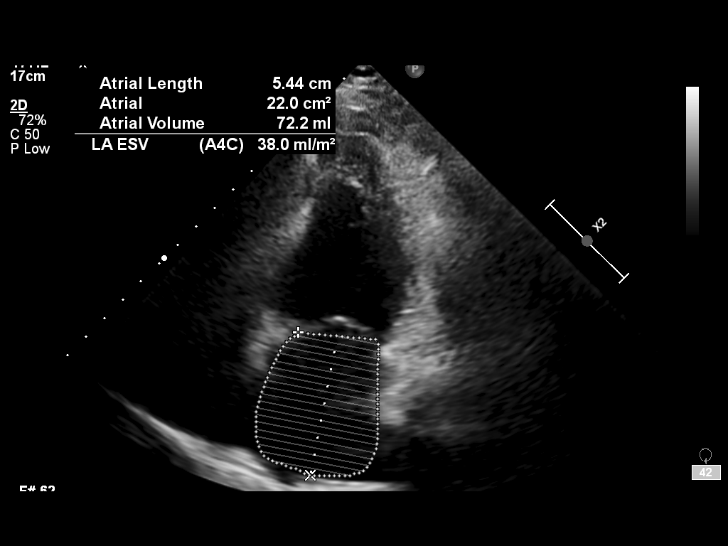
[im 24/43]
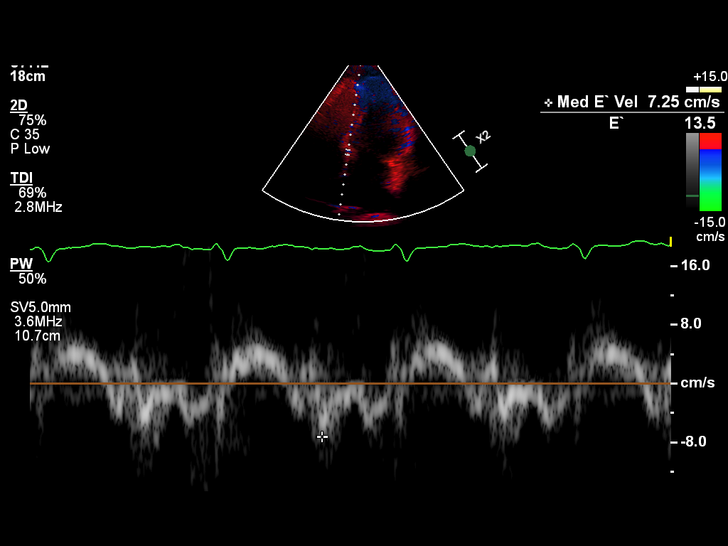
[im 28/43]
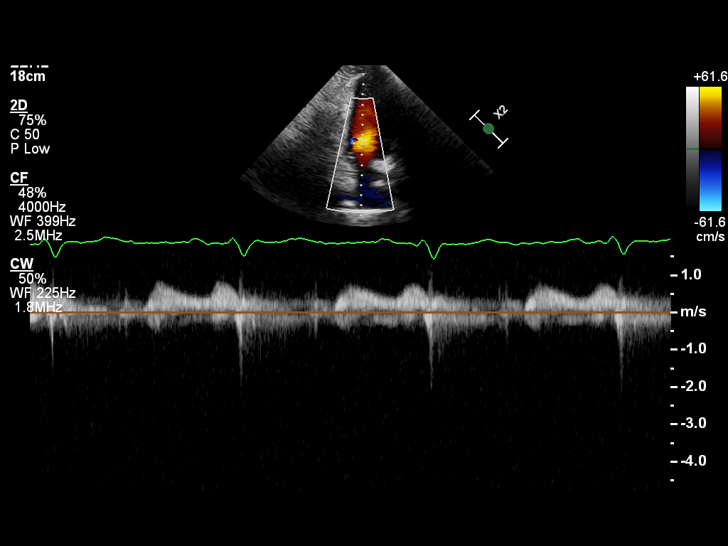
[im 32/43]
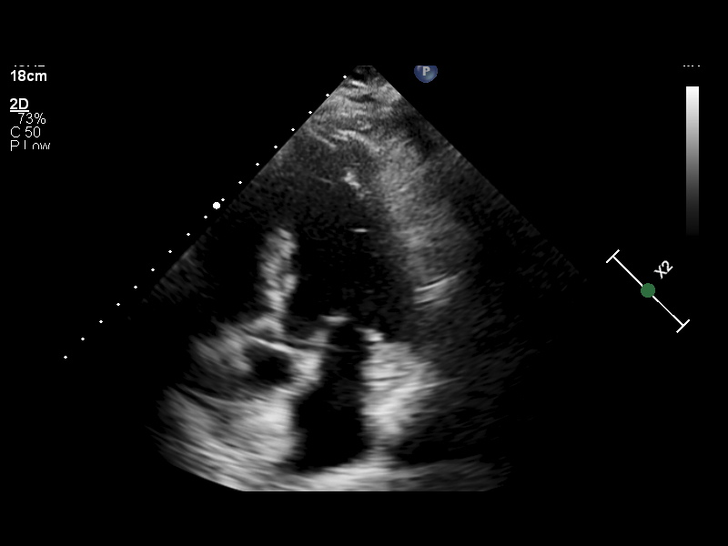
[im 35/43]
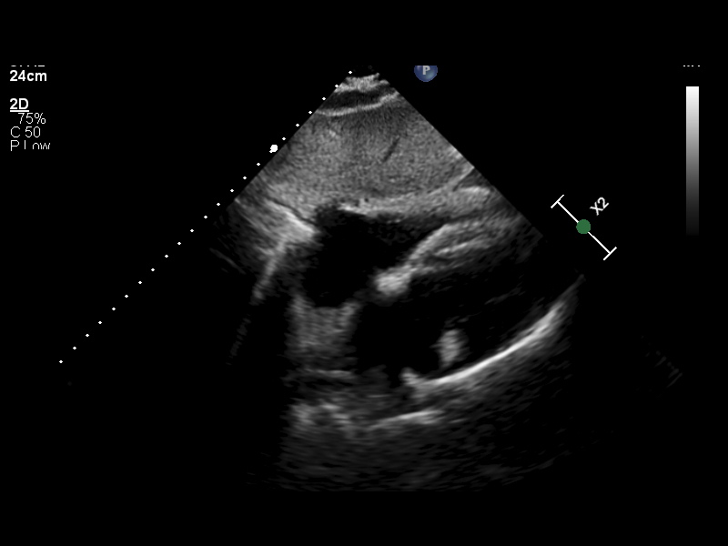
[im 39/43]
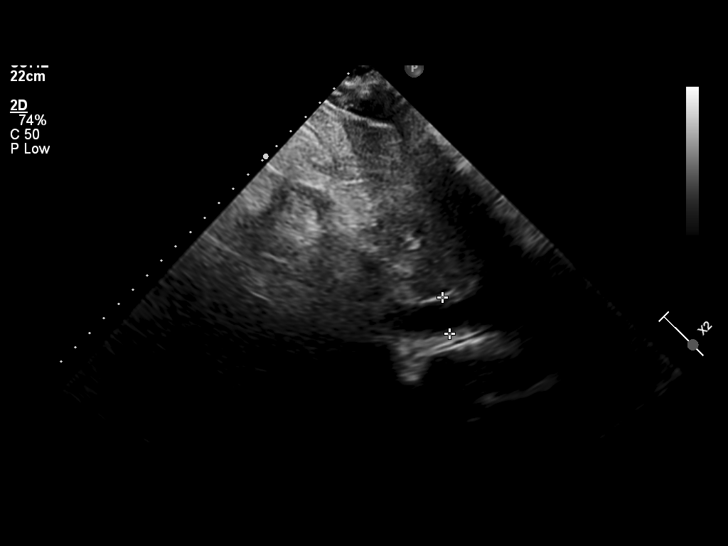
[im 43/43]
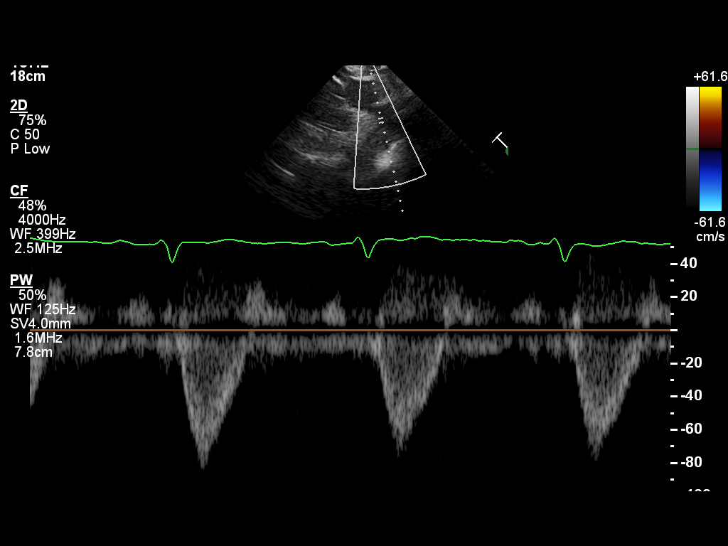

[13 of 24 positions shown; findings below may reference images not displayed]

03/08/22 -  2D + DOPPLER ECHO
Location Performed: [HOSPITAL]

Referring Provider: Tiger, Chai
Interpreting Physician: Tiger, Es
Boo:
Location of Interp:
Sonographer: External Staff
Machine:  [employer]

Indications:           Atrial Fibrillation, CM, HTN

Vitals
Height   Weight   BSA (Calculated)   BP   Comments
160 cm (5' 3")   87 kg (191 lb 12.8 oz)   1.97   132/76

Interpretation Summary
Normal LV size with borderline normal systolic function.  EF is about 50%
Normal LV diastolic function
Normal right ventricular size and systolic function
Normal left atrial size
Normal central venous pressure
No significant structural or functional valvular abnormalities
Normal pulmonary artery systolic pressure
No pericardial effusion
Normal size ascending aorta and aortic root

Compared to prior study from September 2019, findings are similar

Echocardiographic Findings
Left Ventricle   The left ventricular size is normal. Mild concentric hypertrophy. The left
ventricular systolic function is borderline. The visually estimated ejection fraction is 50%. Normal
left ventricular diastolic function. Normal left atrial pressure.
Right Ventricle   The right ventricular size, wall thickness and systolic function are normal.
Left Atrium   Normal size.
Right Atrium   Normal size.
IVC/SVC   Normal central venous pressure (0-5 mm Hg).
Mitral Valve   Normal valve structure. No stenosis. Trace regurgitation.
Tricuspid Valve   No stenosis. Trace regurgitation.
Aortic Valve   Normal valve structure. No stenosis. No regurgitation.
Pulmonary   The pulmonic valve was not seen well but no Doppler evidence of stenosis. Trace
regurgitation.
Aorta   Normal size aortic root and proximal ascending aorta
Pericardium   No pericardial effusion.

Left Ventricular Wall Scoring
Score Index: 1.000   Percent Normal: 100.0%

The left ventricular wall motion is normal.

Left Heart 2D Measurements (Normal Ranges)
EF (Visual)
50 %
LVIDD
4.7 cm  (Range: 3.8 - 5.2)
LVIDS
3.7 cm  (Range: 2.2 - 3.5)
IVS
1.1 cm  (Range: 0.6 - 0.9)
LV PW
1.2 cm  (Range: 0.6 - 0.9)
LA Size
4.1 cm  (Range: 2.7 - 3.8)

Right Heart 2D   M-Mode Measurements (Normal Ranges) (Range)
RV Basal Dia
3.2 cm  (2.5 - 4.1)
RV Mid Dia
1.9 cm  (1.9 - 3.5)
HEGOI
14.1 cm2  (<18)
M-Mode TAPSE
2.7 cm  (>1.7)

Left Heart 2D Addnl Measurements (Normal Ranges)
LV Systolic Vol
34 mL  (Range: 14 - 42)
LV Systolic Vol Index
17 mL/m2  (Range: 8 - 24)
LV Diastolic Vol
72 mL  (Range: 46 - 106)
LV Diastolic Vol Index
37 mL/m2  (Range: 29 - 61)
LA Vol
65 mL  (Range: 22 - 52)
LA Vol Index
32.99 mL/m2  (Range: 16 - 34)
LV Mass
200 g  (Range: 67 - 162)
LV Mass Index
101 g/m2  (Range: 43 - 95)
RWT
0.51  (Range: <=0.42)

Aortic Root Measurements (Normal Ranges)
Sinus
3.3 cm  (Range: 2.4 - 3.6)
AO Prox
3.1 cm  (Range: 1.9 - 3.5)

Doppler (Spectral and Color Flow)
Estimated Peak Systolic PA Pressure
Aortic valve peak velocity
1.2 m/s

Tech Notes:

## 2023-05-19 ENCOUNTER — Encounter: Admit: 2023-05-19 | Discharge: 2023-05-19 | Payer: MEDICARE

## 2023-05-19 MED ORDER — LOSARTAN 50 MG PO TAB
ORAL_TABLET | 0 refills
Start: 2023-05-19 — End: ?

## 2023-05-20 ENCOUNTER — Encounter: Admit: 2023-05-20 | Discharge: 2023-05-20 | Payer: MEDICARE

## 2023-05-24 ENCOUNTER — Encounter: Admit: 2023-05-24 | Discharge: 2023-05-24 | Payer: MEDICARE

## 2023-05-24 DIAGNOSIS — I10 Essential (primary) hypertension: Secondary | ICD-10-CM

## 2023-05-24 DIAGNOSIS — E782 Mixed hyperlipidemia: Secondary | ICD-10-CM

## 2023-05-24 LAB — COMPREHENSIVE METABOLIC PANEL
ANION GAP: 7 — ABNORMAL LOW (ref 8–16)
BLD UREA NITROGEN: 19
CHLORIDE: 107
CO2: 28
CREATININE: 0.7
GFR ESTIMATED: 77
GLUCOSE,PANEL: 111 — ABNORMAL HIGH (ref 70–105)
POTASSIUM: 4.1
SODIUM: 142

## 2023-05-24 LAB — LIPID PROFILE
CHOLESTEROL/HDL %: 3
CHOLESTEROL: 140
HDL: 43
LDL: 66
TRIGLYCERIDES: 158 — ABNORMAL HIGH (ref <150)
VLDL: 32

## 2023-07-18 ENCOUNTER — Encounter: Admit: 2023-07-18 | Discharge: 2023-07-18 | Payer: MEDICARE

## 2023-07-18 ENCOUNTER — Ambulatory Visit: Admit: 2023-07-18 | Discharge: 2023-07-18 | Payer: MEDICARE

## 2023-07-18 DIAGNOSIS — E669 Obesity, unspecified: Secondary | ICD-10-CM

## 2023-07-18 DIAGNOSIS — I429 Cardiomyopathy, unspecified: Secondary | ICD-10-CM

## 2023-07-18 DIAGNOSIS — E781 Pure hyperglyceridemia: Secondary | ICD-10-CM

## 2023-07-18 DIAGNOSIS — M4802 Spinal stenosis, cervical region: Secondary | ICD-10-CM

## 2023-07-18 DIAGNOSIS — I440 Atrioventricular block, first degree: Secondary | ICD-10-CM

## 2023-07-18 DIAGNOSIS — I493 Ventricular premature depolarization: Secondary | ICD-10-CM

## 2023-07-18 DIAGNOSIS — E785 Hyperlipidemia, unspecified: Secondary | ICD-10-CM

## 2023-07-18 DIAGNOSIS — I10 Essential (primary) hypertension: Secondary | ICD-10-CM

## 2023-07-18 DIAGNOSIS — I499 Cardiac arrhythmia, unspecified: Secondary | ICD-10-CM

## 2023-07-18 DIAGNOSIS — I4892 Unspecified atrial flutter: Secondary | ICD-10-CM

## 2023-07-18 DIAGNOSIS — C519 Malignant neoplasm of vulva, unspecified: Secondary | ICD-10-CM

## 2023-07-18 DIAGNOSIS — I4891 Unspecified atrial fibrillation: Secondary | ICD-10-CM

## 2023-07-18 DIAGNOSIS — Z0389 Encounter for observation for other suspected diseases and conditions ruled out: Secondary | ICD-10-CM

## 2023-07-18 DIAGNOSIS — Z780 Asymptomatic menopausal state: Secondary | ICD-10-CM

## 2023-07-18 DIAGNOSIS — Z136 Encounter for screening for cardiovascular disorders: Secondary | ICD-10-CM

## 2023-07-18 DIAGNOSIS — E7800 Pure hypercholesterolemia, unspecified: Secondary | ICD-10-CM

## 2023-07-18 DIAGNOSIS — K759 Inflammatory liver disease, unspecified: Secondary | ICD-10-CM

## 2023-07-18 DIAGNOSIS — E782 Mixed hyperlipidemia: Secondary | ICD-10-CM

## 2023-07-18 DIAGNOSIS — Z9889 Other specified postprocedural states: Secondary | ICD-10-CM

## 2023-07-18 DIAGNOSIS — G4730 Sleep apnea, unspecified: Secondary | ICD-10-CM

## 2023-07-18 DIAGNOSIS — R000 Tachycardia, unspecified: Secondary | ICD-10-CM

## 2023-07-18 DIAGNOSIS — Z5181 Encounter for therapeutic drug level monitoring: Secondary | ICD-10-CM

## 2023-07-18 DIAGNOSIS — Z23 Encounter for immunization: Secondary | ICD-10-CM

## 2023-07-18 DIAGNOSIS — Z9229 Personal history of other drug therapy: Secondary | ICD-10-CM

## 2023-07-18 DIAGNOSIS — I447 Left bundle-branch block, unspecified: Secondary | ICD-10-CM

## 2023-07-18 DIAGNOSIS — R002 Palpitations: Secondary | ICD-10-CM

## 2023-07-18 DIAGNOSIS — K929 Disease of digestive system, unspecified: Secondary | ICD-10-CM

## 2023-07-18 DIAGNOSIS — M1990 Unspecified osteoarthritis, unspecified site: Secondary | ICD-10-CM

## 2023-07-18 DIAGNOSIS — I2510 Atherosclerotic heart disease of native coronary artery without angina pectoris: Secondary | ICD-10-CM

## 2023-07-18 DIAGNOSIS — I480 Paroxysmal atrial fibrillation: Secondary | ICD-10-CM

## 2023-07-18 DIAGNOSIS — G4733 Obstructive sleep apnea (adult) (pediatric): Secondary | ICD-10-CM

## 2023-07-18 DIAGNOSIS — R0600 Dyspnea, unspecified: Secondary | ICD-10-CM

## 2023-07-18 DIAGNOSIS — U071 COVID-19 virus infection: Secondary | ICD-10-CM

## 2023-07-18 DIAGNOSIS — K449 Diaphragmatic hernia without obstruction or gangrene: Secondary | ICD-10-CM

## 2023-07-18 MED ORDER — LOSARTAN 25 MG PO TAB
25 mg | ORAL_TABLET | Freq: Every day | ORAL | 3 refills | 90.00000 days | Status: AC
Start: 2023-07-18 — End: ?

## 2023-07-18 MED ORDER — CARVEDILOL 25 MG PO TAB
25 mg | ORAL_TABLET | Freq: Two times a day (BID) | ORAL | 0 refills | 90.00000 days | Status: AC
Start: 2023-07-18 — End: ?

## 2023-07-18 MED ORDER — LOSARTAN 50 MG PO TAB
50 mg | ORAL_TABLET | Freq: Every day | ORAL | 3 refills | 90.00000 days | Status: AC
Start: 2023-07-18 — End: ?

## 2023-07-18 MED ORDER — ATORVASTATIN 40 MG PO TAB
40 mg | ORAL_TABLET | Freq: Every evening | ORAL | 0 refills | Status: AC
Start: 2023-07-18 — End: ?

## 2023-07-18 NOTE — Patient Instructions
14 day monitor     Follow up in 6 months    Follow up as directed.  Call sooner if issues.  Call the [place] nursing line at [phone].  Leave a detailed message for the nurse in [place] with how we can assist you and we will call you back.

## 2023-07-18 NOTE — Progress Notes
Date of Service: 07/18/2023    Crystal Savage is a 77 y.o. female.       HPI         Crystal Savage is a 77y.o. 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    Patient is doing well from a cardiac standpoint.  She does not report symptoms of chest pain or heart palpitations.  She does have a history of atrial fibrillation, she underwent pulmonary vein isolation, she continues on flecainide and also apixaban.    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  Cavotricuspid Isthmus Ablation  11/10/2018 - Zio Patch:  Normal sinus rhythm.  No evidence of atrial fibrillation.  No symptoms.  Occasional short PAT.  10/01/2019 - Zio Patch:  Normal sinus rhythm.  Isolated and rare episodes of nonsustained SVT, the longest episode lasted 10 beats.  Overall supraventricular ectopy burden was less than 1% of the total number of beats.  Rare ventricular ectopy, no VT, no bigeminy and no trigeminy, the total burden was less than 1% of the total number of beats.  No evidence of atrial fibrillation and no atrial flutter.      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 Mild plaquing to LM, LAD with 30% mid lesion, otherwise non obstructive dz.      Cardiomyopathy (HCC) 09/28/2010     09/30/2019 - ECHO:  Borderline global LV hypokinesis with an estimated ejection fraction of 50%.  Normal RV size and function.  The atria are normal sized.  Normal valve morphology with no significant Doppler abnormality.      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 Risk factors for coronary artery disease.            Review of Systems   Constitutional: Negative.   HENT: Negative.     Eyes: Negative.    Cardiovascular: Negative.    Respiratory: Negative.     Endocrine: Negative.    Hematologic/Lymphatic: Negative.    Skin: Negative.    Musculoskeletal: Negative.    Gastrointestinal: Negative.    Genitourinary: Negative.    Neurological: Negative.    Psychiatric/Behavioral: Negative.     Allergic/Immunologic: Negative. Physical Exam  General Appearance: normal in appearance  Skin: warm, moist, no ulcers or xanthomas  Eyes: conjunctivae and lids normal, pupils are equal and round  Lips & Oral Mucosa: no pallor or cyanosis  Neck Veins: neck veins are flat, neck veins are not distended  Chest Inspection: chest is normal in appearance  Respiratory Effort: breathing comfortably, no respiratory distress  Auscultation/Percussion: lungs clear to auscultation, no rales or rhonchi, no wheezing  Cardiac Rhythm: regular rhythm and normal rate  Cardiac Auscultation: S1, S2 normal, no rub, no gallop  Murmurs: no murmur  Carotid Arteries: normal carotid upstroke bilaterally, no bruit  Lower Extremity Edema: no lower extremity edema  Abdominal Exam: soft, non-tender, no masses, bowel sounds normal  Liver & Spleen: no organomegaly  Language and Memory: patient responsive and seems to comprehend information  Neurologic Exam: neurological assessment grossly intact    Cardiovascular Studies  A twelve-lead EKG demonstrates normal sinus rhythm, ventricular rate 69 bpm, left axis deviation, IVCD/atypical LBBB, QRS width = 136 ms.    Cardiovascular Health Factors  Vitals BP Readings from Last 3 Encounters:   07/18/23 (!) 142/78   08/16/22 (!) 158/88   03/08/22 132/76     Wt Readings from Last 3 Encounters:   07/18/23 85.4 kg (188 lb 3.2 oz)   08/16/22 85 kg (187 lb 6.4 oz)   03/08/22 87 kg (191 lb 12.8 oz)     BMI Readings from Last 3 Encounters:   07/18/23 33.34 kg/m?   08/16/22 33.20 kg/m?   03/08/22 33.98 kg/m?      Smoking Social History     Tobacco Use   Smoking Status Never   Smokeless Tobacco Never      Lipid Profile Cholesterol   Date Value Ref Range Status   05/24/2023 140  Final     HDL   Date Value Ref Range Status   05/24/2023 43  Final     LDL   Date Value Ref Range Status   05/24/2023 66  Final     Triglycerides   Date Value Ref Range Status   05/24/2023 158 (H) <150 Final      Blood Sugar Hemoglobin A1C   Date Value Ref Range Status 08/13/2017 6.1  Final     Glucose   Date Value Ref Range Status   05/24/2023 111 (H) 70 - 105 Final   09/13/2021 124 (H) 70 - 105 Final   08/12/2019 132 (H) 73 - 118 Final   03/18/2009 119            Problems Addressed Today  Encounter Diagnoses   Name Primary?    Under observation for suspected coronary artery disease Yes    Status post radiofrequency ablation for arrhythmia     Premature ventricular contractions (PVCs) (VPCs)     LBBB (left bundle  branch block)     Pure hypercholesterolemia     Primary hypertension     First degree AV block     Cardiomyopathy, unspecified type (HCC)     Paroxysmal atrial fibrillation (HCC)     OSA (obstructive sleep apnea)     Heart palpitations     Encounter for monitoring flecainide therapy     COVID-19 virus infection     COVID-19 vaccine administered     Vulvar cancer, carcinoma (HCC)        Assessment and Plan       Assessment:     1.  Paroxysmal atrial fibrillation and atrial flutter  This was diagnosed in September 2019  2.  Status post pulmonary vein isolation and CTI dependent atrial flutter ablation on 08/11/2018  Patient has not experienced any significant recurrent episodes  Patient continues on flecainide  3.  History of high burden PVC  This was diagnosed on a Holter monitor performed in October 2011 at that time the PVC burden was approximately 30%  This was managed with a combination of beta-blocker and class Ic antiarrhythmic drug therapy, patient has not had any significant recurrent events  The most recent long-term monitor performed in November 2020 demonstrated normal sinus rhythm, rare episodes of SVT and SVT, rare ventricular ectopy representing less than 1% of the total number of beats  4.  Status post left heart catheterization, known nonobstructive CAD  5.  Primary hypertension-borderline elevated today, patient states that she probably had salty diet couple of days ago  6.  Known left bundle branch block, IVCD  7.  OSA-patient does use a CPAP machine  8.  History of COVID-19 upper respiratory tract infection with subsequent hypoxia- patient is recovered  9.  Status post wrist cyst evaluation-regadenoson MPI performed in February 2021 demonstrated a mild intensity, apical anterior perfusion abnormality likely due to tissue artifact, normal LVEF = 58%.  10.  History of vulvar cancer  Status post surgical intervention in June 2024-resolved    Plan:    1.  Continue all current medications  2.  Further evaluation with a 14 days Holter monitor  If patient will continue to demonstrate sinus mechanism, likely will continue with the class Ic AAD but discontinue the apixaban  3.  Follow-up office visit in 6 to 7 months.         Current Medications (including today's revisions)   acetaminophen SR (TYLENOL) 650 mg tablet Take one tablet by mouth every 6 hours as needed for Pain.    alendronate (FOSAMAX) 70 mg tablet Take one tablet by mouth every 7 days.    aspirin EC 81 mg tablet Take one tablet by mouth at bedtime daily. Take with food.    atorvastatin (LIPITOR) 40 mg tablet TAKE 1 TABLET BY MOUTH ONCE DAILY AT BEDTIME    carvediloL (COREG) 25 mg tablet TAKE 1 TABLET BY MOUTH TWICE DAILY WITH MEALS    cetirizine (ZYRTEC) 10 mg tablet Take one tablet by mouth every morning.    cholecalciferol (VITAMIN D-3) 1,000 units tablet Take one tablet by mouth daily.    ELIQUIS 5 mg tablet Take 1 tablet by mouth twice daily    flecainide (TAMBOCOR) 50 mg tablet Take 1/2 (one-half) tablet by mouth twice daily    fluticasone propionate (FLONASE) 50 mcg/actuation nasal spray Apply two sprays to each nostril as directed daily. Shake bottle gently before using.    hydroCHLOROthiazide (HYDRODIURIL) 25 mg tablet TAKE 1/2 (ONE-HALF) TABLET BY MOUTH ONCE  DAILY IN THE MORNING    losartan (COZAAR) 25 mg tablet TAKE 1 TABLET BY MOUTH ONCE DAILY WITH SUPPER    losartan (COZAAR) 50 mg tablet Take 1 tablet by mouth once daily with breakfast    melatonin 3 mg tab Take one tablet by mouth at bedtime as needed.    triamcinolone acetonide 0.5 % ointment as Needed.    vit A/vit C/vit E/zinc/copper (PRESERVISION AREDS PO) Take  by mouth.    ZINC PO Take  by mouth.

## 2023-07-18 NOTE — Progress Notes
To our valued patient,     We have enrolled your heart monitor and requested it be sent to your home.  You should receive this within 2-3 business days. Please wear the monitor for 14 days. When you have completed the study, please remove the device, and mail it back to the company. Please call [employer] Customer Service at [phone] with questions about placement, troubleshooting, and insurance coverage. You can reach the [facility] ambulatory heart monitor team at [phone].      Your Heart Rhythm Management Team  Cardiovascular Medicine Department at [facility]              Ambulatory (External) Cardiac Monitor Enrollment Record     Placement Location: Home Enrollment  Clinic Location: [facility]  Vendor: [employer] ([employer])  Mobile Cardiac Telemetry (MCOT/MCT)?: No  Duration of Monitor (in days): 14  Monitor Diagnosis: Atrial Fibrillation (I48.91)  Secondary Monitor Diagnosis: PVC (premature ventricular contraction) (I49.3)  Ordering Provider: Nickolas Madrid  AMB Monitor Serial Number: Home  No data recorded    Start Time and Date: 07/18/23 11:36 AM   Patient Name: Crystal Savage  DOB: 02/24/46 08-Dec-1945  MRN: [id number]  Sex: female  Mobile Phone Number: [phone] (mobile)  Home Phone Number: [phone]  Patient Address: [address] [place] [address]  Insurance Coverage: MEDICARE PART A AND B  Insurance ID: [id number]  Insurance Group #:   Insurance Subscriber: Mode,Shaine K  Implanted Cardiac Device Information: No results found for: [id number]      Patient instructed to contact company phone number on the monitor box with questions regarding billing, placement, troubleshooting.     Rosalin Hawking    ____________________________________________________________    Clinic Staff:    Complete additional steps for documentation double check/Co-Sign.  In Follow-up, send chart upon closing encounter to [facility] AMBULATORY MONITORS    [facility] Ambulatory Monitoring Team:  Schedule on appropriate template and check-in.   Clinic Placement Schedule on clinic location [facility] schedule   Home Enrollment Schedule on Home Enrollment schedule ([facility])   Given to patient in clinic for self-placement Schedule on Home Enrollment schedule ([facility])   Inpatient Schedule on [facility] AMBULATORY MONITORING template   2. Please enroll with appropriate vendor.

## 2023-08-16 ENCOUNTER — Encounter: Admit: 2023-08-16 | Discharge: 2023-08-16 | Payer: MEDICARE

## 2023-08-26 ENCOUNTER — Encounter: Admit: 2023-08-26 | Discharge: 2023-08-26 | Payer: MEDICARE

## 2023-08-26 NOTE — Telephone Encounter
Sent mychart message with results and recommendations.

## 2023-08-26 NOTE — Telephone Encounter
-----   Message from Selinda Flavin, MD sent at 08/23/2023  8:37 PM CDT -----  Please let this patient know that the Zio patch did not demonstrate any significant rhythm abnormalities, there are some brief runs when the heart rate accelerated but overall there were no significant serious issues with her heart rhythm.    I think she is supposed to come back and see me in another 6 months or 1 year from the last office visit.    Thank you  ----- Message -----  From: Kerin Perna, MD  Sent: 08/16/2023   8:26 AM CDT  To: Dorris Fetch, MD

## 2023-10-22 ENCOUNTER — Encounter: Admit: 2023-10-22 | Discharge: 2023-10-22 | Payer: MEDICARE

## 2023-10-24 ENCOUNTER — Encounter: Admit: 2023-10-24 | Discharge: 2023-10-24 | Payer: MEDICARE

## 2023-10-24 NOTE — Telephone Encounter
Discussed in clinic with Dr. Avie Arenas by Debbe Odea, RN.  Okay to hold Eliquis for 3 days prior to procedure as requested.      Called recommendations to pain clinic.

## 2023-10-24 NOTE — Telephone Encounter
Received a call from the [facility] clinic (phone: [phone]) requesting approval to hold Eliquis for 3 days prior to an epidural injection.  Crystal Savage is scheduled for the procedure on 12/05/23.    Crystal Savage is on Eliquis for atrial fibrillation.  Her CHADSVASC score is 4 for age, gender and history of hypertension.      Will review with Dr. Avie Arenas for review and recommendations.

## 2023-10-28 ENCOUNTER — Encounter: Admit: 2023-10-28 | Discharge: 2023-10-28 | Payer: MEDICARE

## 2023-10-28 DIAGNOSIS — I10 Essential (primary) hypertension: Secondary | ICD-10-CM

## 2023-10-28 DIAGNOSIS — E782 Mixed hyperlipidemia: Secondary | ICD-10-CM

## 2023-10-28 MED ORDER — CARVEDILOL 25 MG PO TAB
25 mg | ORAL_TABLET | Freq: Two times a day (BID) | ORAL | 3 refills | 90.00000 days | Status: AC
Start: 2023-10-28 — End: ?

## 2023-10-28 MED ORDER — ATORVASTATIN 40 MG PO TAB
40 mg | ORAL_TABLET | Freq: Every evening | ORAL | 3 refills | Status: AC
Start: 2023-10-28 — End: ?

## 2023-12-14 ENCOUNTER — Encounter: Admit: 2023-12-14 | Discharge: 2023-12-14 | Payer: MEDICARE

## 2023-12-19 ENCOUNTER — Encounter: Admit: 2023-12-19 | Discharge: 2023-12-19 | Payer: MEDICARE

## 2024-01-24 ENCOUNTER — Encounter: Admit: 2024-01-24 | Discharge: 2024-01-24 | Payer: MEDICARE

## 2024-01-29 ENCOUNTER — Encounter: Admit: 2024-01-29 | Discharge: 2024-01-29 | Payer: MEDICARE

## 2024-01-30 ENCOUNTER — Encounter: Admit: 2024-01-30 | Discharge: 2024-01-30 | Payer: MEDICARE

## 2024-01-30 MED ORDER — ELIQUIS 5 MG PO TAB
5 mg | ORAL_TABLET | Freq: Two times a day (BID) | ORAL | 3 refills | Status: AC
Start: 2024-01-30 — End: ?

## 2024-02-04 ENCOUNTER — Encounter: Admit: 2024-02-04 | Discharge: 2024-02-04 | Payer: MEDICARE

## 2024-03-15 ENCOUNTER — Encounter: Admit: 2024-03-15 | Discharge: 2024-03-15 | Payer: MEDICARE

## 2024-03-16 ENCOUNTER — Encounter: Admit: 2024-03-16 | Discharge: 2024-03-16 | Payer: MEDICARE

## 2024-04-02 ENCOUNTER — Encounter: Admit: 2024-04-02 | Discharge: 2024-04-02 | Payer: MEDICARE

## 2024-04-02 DIAGNOSIS — U071 COVID-19 virus infection: Secondary | ICD-10-CM

## 2024-04-02 DIAGNOSIS — I429 Cardiomyopathy, unspecified: Secondary | ICD-10-CM

## 2024-04-02 DIAGNOSIS — I440 Atrioventricular block, first degree: Secondary | ICD-10-CM

## 2024-04-02 DIAGNOSIS — I480 Paroxysmal atrial fibrillation: Secondary | ICD-10-CM

## 2024-04-02 DIAGNOSIS — R079 Chest pain, unspecified: Secondary | ICD-10-CM

## 2024-04-02 DIAGNOSIS — Z136 Encounter for screening for cardiovascular disorders: Secondary | ICD-10-CM

## 2024-04-02 DIAGNOSIS — Z23 Encounter for immunization: Secondary | ICD-10-CM

## 2024-04-02 DIAGNOSIS — Z9889 Other specified postprocedural states: Secondary | ICD-10-CM

## 2024-04-02 DIAGNOSIS — R002 Palpitations: Secondary | ICD-10-CM

## 2024-04-02 DIAGNOSIS — Z0389 Encounter for observation for other suspected diseases and conditions ruled out: Secondary | ICD-10-CM

## 2024-04-02 DIAGNOSIS — G4733 Obstructive sleep apnea (adult) (pediatric): Secondary | ICD-10-CM

## 2024-04-02 DIAGNOSIS — I493 Ventricular premature depolarization: Secondary | ICD-10-CM

## 2024-04-02 DIAGNOSIS — R040 Epistaxis: Secondary | ICD-10-CM

## 2024-04-02 DIAGNOSIS — I447 Left bundle-branch block, unspecified: Secondary | ICD-10-CM

## 2024-04-02 DIAGNOSIS — Z5181 Encounter for therapeutic drug level monitoring: Secondary | ICD-10-CM

## 2024-04-02 DIAGNOSIS — C519 Malignant neoplasm of vulva, unspecified: Secondary | ICD-10-CM

## 2024-04-02 DIAGNOSIS — E7800 Pure hypercholesterolemia, unspecified: Secondary | ICD-10-CM

## 2024-04-02 DIAGNOSIS — I10 Essential (primary) hypertension: Secondary | ICD-10-CM

## 2024-04-02 NOTE — Progress Notes
 Patient requests to have stress test completed at outside facility. Orders for stress test faxed to [facility].

## 2024-04-02 NOTE — Progress Notes
 Date of Service: 04/02/2024    SCHAE CANDO is a 78 y.o. female.       HPI     MECHILLE VARGHESE is a 77y.o. 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    Patient reports increasing dose of shortness of breath and chest pressure that occur mainly with physical activity.  She has also been developing epistaxis.  She continues on apixaban.  She has not had any recurrent symptoms of heart palpitations, no documented atrial arrhythmias.    She was evaluated with a regadenoson MPI in February 2021, it was mildly abnormal with a small sized mild intensity reversible apical anterior perfusion defect, LVEF was 58%.  At that time it was opted to continue medical treatment and to monitor her symptoms.         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  Cavotricuspid Isthmus Ablation  11/10/2018 - Zio Patch:  Normal sinus rhythm.  No evidence of atrial fibrillation.  No symptoms.  Occasional short PAT.  10/01/2019 - Zio Patch:  Normal sinus rhythm.  Isolated and rare episodes of nonsustained SVT, the longest episode lasted 10 beats.  Overall supraventricular ectopy burden was less than 1% of the total number of beats.  Rare ventricular ectopy, no VT, no bigeminy and no trigeminy, the total burden was less than 1% of the total number of beats.  No evidence of atrial fibrillation and no atrial flutter.      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 Mild plaquing to LM, LAD with 30% mid lesion, otherwise non obstructive dz.      Cardiomyopathy (CMS-HCC) 09/28/2010     09/30/2019 - ECHO:  Borderline global LV hypokinesis with an estimated ejection fraction of 50%.  Normal RV size and function.  The atria are normal sized.  Normal valve morphology  with no significant Doppler abnormality.      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 Risk factors for coronary artery disease.            Review of Systems   Constitutional: Negative.   HENT: Negative.     Eyes: Negative.    Cardiovascular: Negative.    Respiratory: Negative.     Endocrine: Negative.    Hematologic/Lymphatic: Negative.    Skin: Negative.    Musculoskeletal: Negative.    Gastrointestinal: Negative. Genitourinary: Negative.    Neurological: Negative.    Psychiatric/Behavioral: Negative.     Allergic/Immunologic: Negative.        Physical Exam  General Appearance: normal in appearance  Skin: warm, moist, no ulcers or xanthomas  Eyes: conjunctivae and lids normal, pupils are equal and round  Lips & Oral Mucosa: no pallor or cyanosis  Neck Veins: neck veins are flat, neck veins are not distended  Chest Inspection: chest is normal in appearance  Respiratory Effort: breathing comfortably, no respiratory distress  Auscultation/Percussion: lungs clear to auscultation, no rales or rhonchi, no wheezing  Cardiac Rhythm: regular rhythm and normal rate  Cardiac Auscultation: S1, S2 normal, no rub, no gallop  Murmurs: no murmur  Carotid Arteries: normal carotid upstroke bilaterally, no bruit  Lower Extremity Edema: no lower extremity edema  Abdominal Exam: soft, non-tender, no masses, bowel sounds normal  Liver & Spleen: no organomegaly  Language and Memory: patient responsive and seems to comprehend information  Neurologic Exam: neurological assessment grossly intact      Cardiovascular Studies      Cardiovascular Health Factors  Vitals BP Readings from Last 3 Encounters:   04/02/24 (!) 147/77   07/18/23 (!) 142/78   08/16/22 (!) 158/88     Wt Readings from Last 3 Encounters:   04/02/24 84.5 kg (186 lb 3.2 oz)   07/18/23 85.4 kg (188 lb 3.2 oz)   08/16/22 85 kg (187 lb 6.4 oz)     BMI Readings from Last 3 Encounters:   04/02/24 32.98 kg/m?   07/18/23 33.34 kg/m?   08/16/22 33.20 kg/m?      Smoking Social History     Tobacco Use   Smoking Status Never   Smokeless Tobacco Never      Lipid Profile Cholesterol   Date Value Ref Range Status   10/23/2023 124  Final     HDL   Date Value Ref Range Status   10/23/2023 43  Final     LDL   Date Value Ref Range Status   10/23/2023 68  Final     Triglycerides   Date Value Ref Range Status   10/23/2023 66  Final      Blood Sugar Hemoglobin A1C   Date Value Ref Range Status 08/13/2017 6.1  Final     Glucose   Date Value Ref Range Status   10/23/2023 136 (H) 70 - 105 Final   05/24/2023 111 (H) 70 - 105 Final   09/13/2021 124 (H) 70 - 105 Final          Problems Addressed Today  Encounter Diagnoses   Name Primary?    Under observation for suspected coronary artery disease Yes    Status post radiofrequency ablation for arrhythmia     Premature ventricular contractions (PVCs) (VPCs)  LBBB (left bundle branch block)     Pure hypercholesterolemia     Primary hypertension     First degree AV block     Cardiomyopathy, unspecified type (CMS-HCC)     Paroxysmal atrial fibrillation (CMS-HCC)     Vulvar cancer, carcinoma (CMS-HCC)     OSA (obstructive sleep apnea)     Heart palpitations     COVID-19 vaccine administered     Encounter for monitoring flecainide therapy     COVID-19 virus infection     Screening for heart disease        Assessment and Plan     Assessment:    1.  Symptoms of shortness of breath and chest pain-this have been occurring with physical activity  2.  History of mildly abnormal regadenoson MPI performed in February 2021  At that time, reversibility in the apex was noted, it was opted to continue conservative treatment and observation  3.  History of paroxysmal atrial fibrillation atrial flutter-initially diagnosed in 2019  4.  Status post pulmonary vein isolation, and CTI dependent atrial flutter ablation on 08/11/2028  Patient has not experienced recurrent episodes  She has continued on a class Ic antiarrhythmic drug therapy  She has continued apixaban  5.  Recent history of epistaxis-patient is on apixaban  6.  History of high burden PVCs  This was diagnosed on a Holter monitor performed in October 2011 at that time the PVC burden was approximately 30%  This was managed with a combination of beta-blocker and class Ic antiarrhythmic drug therapy, patient has not had any significant recurrent events  The most recent long-term monitor performed in November 2020 demonstrated normal sinus rhythm, rare episodes of SVT and SVT, rare ventricular ectopy representing less than 1% of the total number of beats  7.  No documented history of obstructive CAD by previous LHC  8.  Primary hypertension  9.  OSA-uses a CPAP machine  10.  History of COVID-19 upper respiratory tract infection with subsequent hypoxia-recovered  11.  History of vulvar cancer-status post surgical intervention, resolved    Plan:    1.  Discontinue apixaban for now  2.  Continue all other cardiac medications  3.  Further evaluation with a regadenoson MPI-will follow-up on the results of the test and will make further recommendations.    Total Time Today was 40 minutes in the following activities: Preparing to see the patient, Obtaining and/or reviewing separately obtained history, Performing a medically appropriate examination and/or evaluation, Counseling and educating the patient/family/caregiver, Ordering medications, tests, or procedures, Referring and communication with other health care professionals (when not separately reported), Documenting clinical information in the electronic or other health record, Independently interpreting results (not separately reported) and communicating results to the patient/family/caregiver, and Care coordination (not separately reported)          Current Medications (including today's revisions)   acetaminophen SR (TYLENOL) 650 mg tablet Take one tablet by mouth every 6 hours as needed for Pain.    alendronate (FOSAMAX) 70 mg tablet Take one tablet by mouth every 7 days.    aspirin EC 81 mg tablet Take one tablet by mouth at bedtime daily. Take with food.    atorvastatin (LIPITOR) 40 mg tablet TAKE 1 TABLET BY MOUTH EVERY DAY AT BEDTIME    carvediloL (COREG) 25 mg tablet TAKE 1 TABLET BY MOUTH TWICE DAILY WITH MEALS    cetirizine (ZYRTEC) 10 mg tablet Take one tablet by mouth every morning.    cholecalciferol (  VITAMIN D-3) 1,000 units tablet Take one tablet by mouth daily. ELIQUIS 5 mg tablet Take one tablet by mouth twice daily.    flecainide (TAMBOCOR) 50 mg tablet Take 1/2 (one-half) tablet by mouth twice daily    fluticasone propionate (FLONASE) 50 mcg/actuation nasal spray Apply two sprays to each nostril as directed daily. Shake bottle gently before using.    hydroCHLOROthiazide (HYDRODIURIL) 25 mg tablet TAKE 1/2 (ONE-HALF) TABLET BY MOUTH ONCE DAILY IN THE MORNING    losartan (COZAAR) 25 mg tablet Take one tablet by mouth daily.    losartan (COZAAR) 50 mg tablet Take one tablet by mouth daily with breakfast.    melatonin 3 mg tab Take one tablet by mouth at bedtime as needed.    triamcinolone acetonide 0.5 % ointment as Needed.    vit A/vit C/vit E/zinc/copper (PRESERVISION AREDS PO) Take  by mouth.    ZINC PO Take  by mouth.

## 2024-04-02 NOTE — Patient Instructions
 Thank you for visiting our office today.    We would like to make the following medication adjustments:      Discontinue taking eliquis       Otherwise continue the same medications as you have been doing.          We will be pursuing the following tests after your appointment today:       Orders Placed This Encounter    REGADENOSON MPI STRESS TEST     Stress test prep: no caffeine for 24 hours. This includes decaf and regular coffee, tea, soda and chocolate. Nothing the eat or drink the morning of the test. You may take your morning medications with a sip of water.     We will plan to see you back in 6 months.  Please call us  in the meantime with any questions or concerns.        Please allow 5-7 business days for our providers to review your results. All normal results will go to MyChart. If you do not have Mychart, it is strongly recommended to get this so you can easily view all your results. If you do not have mychart, we will attempt to call you once with normal lab and testing results. If we cannot reach you by phone with normal results, we will send you a letter.  If you have not heard the results of your testing after one week please give us  a call.       Your [facility] [place] [place] [facility] [place])  phone number is [phone].

## 2024-04-07 ENCOUNTER — Ambulatory Visit: Admit: 2024-04-07 | Discharge: 2024-04-07 | Payer: MEDICARE

## 2024-04-07 ENCOUNTER — Encounter: Admit: 2024-04-07 | Discharge: 2024-04-07 | Payer: MEDICARE

## 2024-04-20 ENCOUNTER — Encounter: Admit: 2024-04-20 | Discharge: 2024-04-20 | Payer: MEDICARE

## 2024-04-20 NOTE — Telephone Encounter
-----   Message from Wilhelmenia Harada, MD sent at 04/18/2024  7:45 PM CDT -----  Please call the patient to let her know that the stress that did not show any significant blockage, normal heart function.    Thank you    ----- Message -----  From: Renate Caroline, MD  Sent: 04/08/2024   1:49 PM CDT  To: Pamella Boer, MD

## 2024-04-20 NOTE — Telephone Encounter
 Results and recommendations called to patient lmom requested call back if questions

## 2024-08-09 ENCOUNTER — Encounter: Admit: 2024-08-09 | Discharge: 2024-08-09 | Payer: MEDICARE

## 2024-09-06 ENCOUNTER — Encounter: Admit: 2024-09-06 | Discharge: 2024-09-06 | Payer: MEDICARE

## 2024-09-07 ENCOUNTER — Encounter: Admit: 2024-09-07 | Discharge: 2024-09-07 | Payer: MEDICARE

## 2024-10-06 ENCOUNTER — Encounter: Admit: 2024-10-06 | Discharge: 2024-10-06 | Payer: MEDICARE

## 2024-10-15 ENCOUNTER — Encounter: Admit: 2024-10-15 | Discharge: 2024-10-15 | Payer: MEDICARE

## 2024-10-15 VITALS — BP 160/85 | HR 70 | Ht 63.0 in | Wt 187.6 lb

## 2024-10-15 DIAGNOSIS — Z23 Encounter for immunization: Secondary | ICD-10-CM

## 2024-10-15 DIAGNOSIS — I10 Essential (primary) hypertension: Secondary | ICD-10-CM

## 2024-10-15 DIAGNOSIS — I429 Cardiomyopathy, unspecified: Secondary | ICD-10-CM

## 2024-10-15 DIAGNOSIS — U071 COVID-19 virus infection: Secondary | ICD-10-CM

## 2024-10-15 DIAGNOSIS — R0989 Other specified symptoms and signs involving the circulatory and respiratory systems: Secondary | ICD-10-CM

## 2024-10-15 DIAGNOSIS — I440 Atrioventricular block, first degree: Secondary | ICD-10-CM

## 2024-10-15 DIAGNOSIS — I447 Left bundle-branch block, unspecified: Secondary | ICD-10-CM

## 2024-10-15 DIAGNOSIS — I493 Ventricular premature depolarization: Secondary | ICD-10-CM

## 2024-10-15 DIAGNOSIS — I480 Paroxysmal atrial fibrillation: Secondary | ICD-10-CM

## 2024-10-15 DIAGNOSIS — Z9889 Other specified postprocedural states: Secondary | ICD-10-CM

## 2024-10-15 DIAGNOSIS — G4733 Obstructive sleep apnea (adult) (pediatric): Secondary | ICD-10-CM

## 2024-10-15 DIAGNOSIS — E7800 Pure hypercholesterolemia, unspecified: Secondary | ICD-10-CM

## 2024-10-15 DIAGNOSIS — R002 Palpitations: Secondary | ICD-10-CM

## 2024-10-15 DIAGNOSIS — Z0389 Encounter for observation for other suspected diseases and conditions ruled out: Principal | ICD-10-CM

## 2024-10-15 DIAGNOSIS — C519 Malignant neoplasm of vulva, unspecified: Secondary | ICD-10-CM

## 2024-10-15 NOTE — Progress Notes [1]
 Date of Service: 10/15/2024    Crystal Savage is a 78 y.o. female.       HPI      Crystal Savage is a 78 y.o. 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    She has not had any history of recurrent atrial arrhythmias and continues to do well from a cardiac standpoint.  She has not had any chest pain, no heart palpitations, no presyncope or syncope.    She was last evaluated 8 with a perfusion imaging study on 04/07/2024, it did not demonstrate ischemia, LVEF = 57%.       Objective   Vitals:    10/15/24 0953   BP: (!) 160/85  Comment: Patient did not take medications this morning.   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  Cavotricuspid Isthmus Ablation  11/10/2018 - [facility]io Patch:  Normal sinus rhythm.  No evidence of atrial fibrillation.  No symptoms.  Occasional short PAT.  10/01/2019 - Zio Patch:  Normal sinus rhythm.  Isolated and rare episodes of nonsustained SVT, the longest episode lasted 10 beats.  Overall supraventricular ectopy burden was less than 1% of the total number of beats.  Rare ventricular ectopy, no VT, no bigeminy and no trigeminy, the total burden was less than 1% of the total number of beats.  No evidence of atrial fibrillation and no atrial flutter.      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 Mild plaquing to LM, LAD with 30% mid lesion, otherwise non obstructive dz.      Cardiomyopathy (CMS-HCC) 09/28/2010     09/30/2019 - ECHO:  Borderline global LV hypokinesis with an estimated ejection fraction of 50%.  Normal RV size and function.  The atria are  normal sized.  Normal valve morphology with no significant Doppler abnormality.      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 Risk factors for coronary artery disease.          Review of Systems   Constitutional: Negative.   HENT: Negative.     Eyes: Negative.    Cardiovascular:  Positive for palpitations.   Respiratory: Negative.     Endocrine: Negative.    Hematologic/Lymphatic: Negative.    Skin: Negative.    Musculoskeletal: Negative.    Gastrointestinal: Negative. Genitourinary: Negative.    Neurological: Negative.    Psychiatric/Behavioral: Negative.     Allergic/Immunologic: Negative.        Physical Exam  General Appearance: normal in appearance  Skin: warm, moist, no ulcers or xanthomas  Eyes: conjunctivae and lids normal, pupils are equal and round  Lips & Oral Mucosa: no pallor or cyanosis  Neck Veins: neck veins are flat, neck veins are not distended  Chest Inspection: chest is normal in appearance  Respiratory Effort: breathing comfortably, no respiratory distress  Auscultation/Percussion: lungs clear to auscultation, no rales or rhonchi, no wheezing  Cardiac Rhythm: regular rhythm and normal rate  Cardiac Auscultation: S1, S2 normal, no rub, no gallop  Murmurs: no murmur  Carotid Arteries: normal carotid upstroke bilaterally, no bruit  Lower Extremity Edema: no lower extremity edema  Abdominal Exam: soft, non-tender, no masses, bowel sounds normal  Liver & Spleen: no organomegaly  Language and Memory: patient responsive and seems to comprehend information  Neurologic Exam: neurological assessment grossly intact      Cardiovascular Studies:    Twelve-lead EKG demonstrates incomplete LBBB/IVCD, QRS with 128 ms, poor R wave progression across the precordium, no significant ST segment T wave changes.    Cardiovascular Health Factors  Vitals BP Readings from Last 3 Encounters:   10/15/24 (!) 160/85   04/02/24 (!) 147/77   07/18/23 (!) 142/78     Wt Readings from Last 3 Encounters:   10/15/24 85.1 kg (187 lb 9.6 oz)   04/02/24 84.5 kg (186 lb 3.2 oz)   07/18/23 85.4 kg (188 lb 3.2 oz)     BMI Readings from Last 3 Encounters:   10/15/24 33.23 kg/m?   04/02/24 32.98 kg/m?   07/18/23 33.34 kg/m?      Smoking Tobacco Use History[1]   Lipid Profile Cholesterol   Date Value Ref Range Status   10/23/2023 124  Final     HDL   Date Value Ref Range Status   10/23/2023 43  Final     LDL   Date Value Ref Range Status   10/23/2023 68  Final     Triglycerides   Date Value Ref Range Status   10/23/2023 66  Final      Blood Sugar Hemoglobin A1C   Date Value Ref Range Status   08/13/2017 6.1  Final     Glucose   Date Value Ref Range Status   10/23/2023 136 (H) 70 - 105 Final   05/24/2023 111 (H) 70 - 105 Final   09/13/2021 124 (H) 70 - 105 Final         Problems Addressed Today  Encounter Diagnoses   Name Primary?    Under observation for suspected coronary artery disease Yes    Status post radiofrequency ablation  for arrhythmia     Premature ventricular contractions (PVCs) (VPCs)     LBBB (left bundle branch block)     Pure hypercholesterolemia     Primary hypertension     First degree AV block     Cardiomyopathy, unspecified type (CMS-HCC)     Paroxysmal atrial fibrillation (CMS-HCC)     Vulvar cancer, carcinoma (CMS-HCC)     OSA (obstructive sleep apnea)     COVID-19 virus infection     COVID-19 vaccine administered     Heart palpitations     Cardiovascular symptoms        Assessment and Plan     Assessment:    1.  History of paroxysmal atrial fibrillation and atrial flutter  Initially diagnosed in 2019  Status post PVI and CTI dependent atrial flutter ablation in September 2019  Patient continues on a class Ic antiarrhythmic drug therapy  2.  Right nostril growth and subsequent epistaxis  Patient did undergo ENT evaluation, concern for malignancy was not raised  She did stop the apixaban   3.  History of high burden PVC, history of PVC induced cardiomyopathy - recovered  This was diagnosed on a Holter monitor performed in October 2011, at the time the PVC burden was 30%  This was managed with a combination of beta-blocker and class Ic AAD without any recurrent events  The most recent 14 days Holter monitor performed in November 2020 demonstrated NSR, rare episodes of SVT and SVT, rare ventricular ectopy representing less than 1% of the total number of beats  4.  No documented history of CAD, status post LHC  5.  OSA-uses a CPAP machine  6.  Primary hypertension-elevated today, patient states that she did not take her BP medications  7.  History of vulvar cancer-status post surgical intervention, incomplete remission  8.  Obesity, elevated BMI = 33.23 kg/m?    Plan:    1.  Continue all current medications  2.  Patient do not resume the apixaban  due to the potential risk of epistaxis from the right nostril  3.  Continue risk factors modifications  4.  Follow-up office visit in 6 months      Total Time Today was 40 minutes in the following activities: Preparing to see the patient, Obtaining and/or reviewing separately obtained history, Performing a medically appropriate examination and/or evaluation, Counseling and educating the patient/family/caregiver, Ordering medications, tests, or procedures, Referring and communication with other health care professionals (when not separately reported), Documenting clinical information in the electronic or other health record, Independently interpreting results (not separately reported) and communicating results to the patient/family/caregiver, and Care coordination (not separately reported)          Current Medications (including today's revisions)   acetaminophen  SR (TYLENOL ) 650 mg tablet Take one tablet by mouth every 6 hours as needed for Pain.    alendronate (FOSAMAX) 70 mg tablet Take one tablet by mouth every 7 days.    aspirin  EC 81 mg tablet Take one tablet by mouth at bedtime daily. Take with food.    atorvastatin  (LIPITOR) 40 mg tablet TAKE 1 TABLET BY MOUTH EVERY DAY AT BEDTIME    carvediloL  (COREG ) 25 mg tablet TAKE 1 TABLET BY MOUTH TWICE DAILY WITH MEALS    cetirizine  (ZYRTEC ) 10 mg tablet Take one tablet by mouth every morning.    cholecalciferol (VITAMIN D-3) 1,000 units tablet Take one tablet by mouth daily.    flecainide  (TAMBOCOR ) 50 mg tablet Take 1/2 (one-half) tablet by mouth  twice daily    fluticasone  propionate (FLONASE ) 50 mcg/actuation nasal spray Apply two sprays to each nostril as directed daily as needed. Shake bottle gently before using.    hydroCHLOROthiazide  (HYDRODIURIL ) 25 mg tablet TAKE 1/2 (ONE-HALF) TABLET BY MOUTH ONCE DAILY IN THE MORNING    losartan  (COZAAR ) 25 mg tablet Take 1 tablet by mouth once daily    losartan  (COZAAR ) 50 mg tablet Take 1 tablet by mouth once daily with breakfast    melatonin 3 mg tab Take one tablet by mouth at bedtime as needed.    triamcinolone acetonide 0.5 % ointment as Needed.    vit A/vit C/vit E/zinc/copper (PRESERVISION AREDS PO) Take 1 capsule by mouth twice daily.    ZINC PO Take  by mouth.                 [1]   Social History  Tobacco Use   Smoking Status Never    Passive exposure: Never   Smokeless Tobacco Never

## 2024-10-17 ENCOUNTER — Encounter: Admit: 2024-10-17 | Discharge: 2024-10-17 | Payer: MEDICARE

## 2024-12-04 ENCOUNTER — Encounter: Admit: 2024-12-04 | Discharge: 2024-12-04 | Payer: MEDICARE

## 2024-12-04 DIAGNOSIS — I440 Atrioventricular block, first degree: Secondary | ICD-10-CM

## 2024-12-04 DIAGNOSIS — I429 Cardiomyopathy, unspecified: Secondary | ICD-10-CM

## 2024-12-04 DIAGNOSIS — I10 Essential (primary) hypertension: Principal | ICD-10-CM

## 2024-12-04 DIAGNOSIS — I480 Paroxysmal atrial fibrillation: Secondary | ICD-10-CM

## 2024-12-04 MED ORDER — HYDROCHLOROTHIAZIDE 25 MG PO TAB
12.5 mg | ORAL_TABLET | ORAL | 3 refills | 28.00000 days | Status: AC
Start: 2024-12-04 — End: ?
